# Patient Record
Sex: Male | Born: 1950 | Race: White | Hispanic: No | Marital: Married | Smoking: Former smoker
Health system: Southern US, Community
[De-identification: ages and names within clinical notes are randomized; demographics above are authoritative.]

## PROBLEM LIST (undated history)

## (undated) DIAGNOSIS — C801 Malignant (primary) neoplasm, unspecified: Secondary | ICD-10-CM

## (undated) HISTORY — PX: FINGER SURGERY: SHX640

---

## 2004-10-10 ENCOUNTER — Ambulatory Visit: Payer: Self-pay | Admitting: Internal Medicine

## 2004-10-11 ENCOUNTER — Ambulatory Visit: Payer: Self-pay | Admitting: Internal Medicine

## 2005-10-30 ENCOUNTER — Emergency Department: Payer: Self-pay | Admitting: Emergency Medicine

## 2010-10-22 ENCOUNTER — Emergency Department: Payer: Self-pay | Admitting: Emergency Medicine

## 2012-08-10 ENCOUNTER — Observation Stay: Payer: Self-pay | Admitting: Unknown Physician Specialty

## 2012-08-10 LAB — COMPREHENSIVE METABOLIC PANEL
Albumin: 3.8 g/dL (ref 3.4–5.0)
Anion Gap: 8 (ref 7–16)
BUN: 18 mg/dL (ref 7–18)
Bilirubin,Total: 0.8 mg/dL (ref 0.2–1.0)
Creatinine: 1.09 mg/dL (ref 0.60–1.30)
Glucose: 191 mg/dL — ABNORMAL HIGH (ref 65–99)
Osmolality: 285 (ref 275–301)
Potassium: 4 mmol/L (ref 3.5–5.1)
Sodium: 139 mmol/L (ref 136–145)
Total Protein: 7.9 g/dL (ref 6.4–8.2)

## 2012-08-10 LAB — CBC
HCT: 42.3 % (ref 40.0–52.0)
HGB: 14.9 g/dL (ref 13.0–18.0)
MCH: 30.9 pg (ref 26.0–34.0)
MCHC: 35.2 g/dL (ref 32.0–36.0)
WBC: 10.5 10*3/uL (ref 3.8–10.6)

## 2012-10-13 ENCOUNTER — Ambulatory Visit: Payer: Self-pay | Admitting: Unknown Physician Specialty

## 2012-10-15 LAB — PATHOLOGY REPORT

## 2014-06-24 ENCOUNTER — Emergency Department: Payer: Self-pay | Admitting: Emergency Medicine

## 2014-07-01 ENCOUNTER — Emergency Department: Payer: Self-pay | Admitting: Emergency Medicine

## 2014-10-05 ENCOUNTER — Encounter: Payer: Self-pay | Admitting: Orthopedic Surgery

## 2014-10-31 ENCOUNTER — Encounter: Payer: Self-pay | Admitting: Orthopedic Surgery

## 2015-01-31 ENCOUNTER — Ambulatory Visit: Payer: Self-pay | Admitting: Unknown Physician Specialty

## 2015-04-19 NOTE — H&P (Signed)
PATIENT NAME:  Nathaniel Dixon, Nathaniel Dixon MR#:  [id number] DATE OF BIRTH:  [birth year]  DATE OF ADMISSION:  08/10/2012  HISTORY OF PRESENT ILLNESS: The patient is a 64 year old white male who was eating lunch earlier today and had onset of obstruction of esophagus from food. He came to the Emergency Room where they tried nitroglycerin and Glucagon and then carbonated beverage, none of which helped open the esophagus. I was asked to see him in consultation. He takes no current medications. He has no known drug allergies. 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 He has had moderate difficulties in the past with swallowing, but it has always cleared up.   PHYSICAL EXAMINATION:   GENERAL: White male in no acute distress. Some heaving after drinking small amount of carbonated beverage.   HEENT: Sclerae anicteric. Conjunctivae negative. Tongue negative.   NECK: No thyromegaly.   CHEST: Clear.   HEART: No murmurs, gallops, clicks, or rubs.   ABDOMEN: Bowel sounds are present times. No hepatosplenomegaly. No masses. No bruits. No significant tenderness.  ASSESSMENT AND PLAN: After examination and discussing the procedure of endoscopy for foreign body removal under general anesthesia was discussed with the patient and with the anesthetist, the procedure was then performed with piecemeal removal of the food bolus. The area where the food was in the esophagus showed some esophagitis, inflammation.        The decision was made to admit him to the hospital afterwards for IV medications, IV fluids, PPI therapy, and possible IV pain medicine. NPO except for sips of water. Reassess tomorrow. ____________________________ Manya Silvas, MD rte:slb D: 08/10/2012 21:18:34 ET T: 08/11/2012 07:16:28 ET JOB#: [id number]  cc: Manya Silvas, MD, <Dictator> Manya Silvas MD ELECTRONICALLY SIGNED 08/19/2012 18:30

## 2015-04-19 NOTE — Consult Note (Signed)
Chief complaint is FB obst of esophagus with removal of food bolus.  VSS afebrile, some discomfort in lower throat.  Pt otherwise without complaints.  Will advance to clear liquids and if he tolerates this can go home.  Will need EGD with dilatation in 4 weeks.  Electronic Signatures: Manya Silvas (MD)  (Signed on 12-Aug-13 07:16)  Authored  Last Updated: 12-Aug-13 07:16 by Manya Silvas (MD)

## 2015-04-19 NOTE — Discharge Summary (Signed)
PATIENT NAME:  Nathaniel Dixon, Nathaniel Dixon MR#:  [id number] DATE OF BIRTH:  12/05/1951  DATE OF ADMISSION:  08/10/2012 DATE OF DISCHARGE:  08/11/2012  DISCHARGE DIAGNOSES:  1. Foreign body in the esophagus.  2. Status post esophagogastroduodenoscopy with foreign body removal, successful. 3. LA grade C acute esophagitis.   PROCEDURE: EGD for foreign body removal 08/10/2012.   FINAL PROGRESS NOTE: Patient swallowing clear liquids well, no odynophagia. No nausea, vomiting. Condition is good.   ADDITIONAL MEDICATIONS:  1. Omeprazole 20 mg over the counter 1 tablet 30 minutes before breakfast daily.  2. He was advised to avoid NSAIDs.   FOLLOW UP: Follow up office visit in 1 to 2 weeks, arrange for EGD in four weeks.   HISTORY OF PRESENT ILLNESS: This 64 year old Caucasian male was admitted to the hospital 08/10/2012 after he was eating lunch with pork, and had food bolus hung in the esophagus for about four hours. He presented to the Emergency Room where he was given nitroglycerin and glucagon and carbonated beverage and none of these helped to open the esophagus. Dr. Vira Agar saw him in consultation. Notes that patient has had some chronic intermittent problems swallowing but had always cleared up in the past. An upper endoscopy performed 08/10/2012 admission evening with findings of esophageal foreign body, removal was successful. There was LA grade C acute esophagitis. He was admitted for IV fluids, proton pump therapy, pain medication if needed. Over the course of the admission he did complain of some discomfort in the lower throat.   PAST MEDICAL HISTORY: Denies.   PAST SURGICAL HISTORY: Denies.   MEDICATIONS ON ADMISSION: None listed.   ALLERGIES: No known drug allergies.   LABORATORY, DIAGNOSTIC, AND RADIOLOGICAL DATA: Admission blood work 08/10/2012 with glucose 191, normal MET-B, liver panel with exception of AST slightly elevated 40. CBC normal. No radiology performed.   PHYSICAL  EXAMINATION: VITAL SIGNS: Stable, temperature 98.3. LUNGS: Clear to auscultation. ABDOMEN: Soft, nontender. SKIN: Patient presented with a sunburn from being out at the swimming pool and this noticeable on the trunk and face. No blistering. NEUROLOGICAL: He is alert, oriented, interacting appropriately. No hoarseness noted, swallowing without difficulty.   DISCHARGE INSTRUCTIONS: Discharge instructions given to patient. He was advised to have clear liquids only today, full liquid diet the next two days and we discussed this in detail. On Thursday he may eat soft foods, chew very well for the next month, eat slowly with sips of room temperature liquids in between bites. He is to avoid known trigger foods such as meats, potato chips, popcorn. Patient has plans to go to [facility] the end of October and is asking that the EGD be performed third week in October. We discuss this on follow-up office visit.   These services provided by Joelene Millin A. Jerelene Redden, MS, APRN, BC, ANP under collaborative agreement with Dr. Gaylyn Cheers.   ____________________________ Janalyn Harder. Jerelene Redden, ANP kam:cms D: 08/12/2012 08:34:41 ET T: 08/12/2012 14:21:06 ET JOB#: [id number]  cc: Joelene Millin A. Jerelene Redden, ANP, <Dictator>  Janalyn Harder. Sherlyn Hay, MSN, ANP-BC Adult Nurse Practitioner ELECTRONICALLY SIGNED 08/15/2012 13:08

## 2015-04-25 LAB — SURGICAL PATHOLOGY

## 2016-07-24 ENCOUNTER — Encounter: Payer: Self-pay | Admitting: Podiatry

## 2016-07-24 ENCOUNTER — Ambulatory Visit (INDEPENDENT_AMBULATORY_CARE_PROVIDER_SITE_OTHER): Payer: Medicare Other

## 2016-07-24 ENCOUNTER — Ambulatory Visit (INDEPENDENT_AMBULATORY_CARE_PROVIDER_SITE_OTHER): Payer: Medicare Other | Admitting: Podiatry

## 2016-07-24 DIAGNOSIS — M2042 Other hammer toe(s) (acquired), left foot: Secondary | ICD-10-CM | POA: Diagnosis not present

## 2016-07-24 DIAGNOSIS — L84 Corns and callosities: Secondary | ICD-10-CM | POA: Diagnosis not present

## 2016-07-24 DIAGNOSIS — R52 Pain, unspecified: Secondary | ICD-10-CM | POA: Diagnosis not present

## 2016-07-24 DIAGNOSIS — M2041 Other hammer toe(s) (acquired), right foot: Secondary | ICD-10-CM

## 2016-07-24 DIAGNOSIS — G629 Polyneuropathy, unspecified: Secondary | ICD-10-CM | POA: Diagnosis not present

## 2016-07-25 ENCOUNTER — Telehealth: Payer: Self-pay | Admitting: *Deleted

## 2016-07-25 DIAGNOSIS — G629 Polyneuropathy, unspecified: Secondary | ICD-10-CM

## 2016-07-25 NOTE — Progress Notes (Signed)
Subjective:     Patient ID: Nathaniel Dixon, male   DOB: 15-May-1951, 65 y.o.   MRN: [id number]  HPI 65 year old male presents the office today for concerns of pain to his fourth and fifth toes on both feet with the left side worse in the right. He states that his toes rubbing he gets a blister on the fifth toe. He has had the area turned previously has come back. He has seen other people for this. Denies any recent injury or trauma. He has a states he has numbness and burning pain to both of his feet which is been ongoing for quite some time. He is never seen by for this. He said no recent treatment for this. No other complaints at this time. No other concerns today.  Review of Systems  All other systems reviewed and are negative.      Objective:   Physical Exam General: AAO x3, NAD  Dermatological: Hyperkeratotic lesion present medial aspect left fifth toe. Upon debrided and no underlying ulceration, drainage or other signs of infection. No other open lesions or pre-ulcerative lesions identified this time.  Vascular: Dorsalis Pedis artery and Posterior Tibial artery pedal pulses are 2/4 bilateral with immedate capillary fill time. Pedal hair growth present. There is no pain with calf compression, swelling, warmth, erythema.   Neruologic: Sensation somewhat decreased with Derrel Nip monofilament, vibratory sensation intact, Achilles tendon reflex intact. Negative Tinel sign.   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 Mild tenderness on the hyperkeratotic lesion left fifth toe. No other areas of tenderness bilaterally. MMT 5/5, ROM WNL. Equinus.   Gait: Unassisted, Nonantalgic.      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 As of acute fracture or stress fracture this time. Hammertoes present. -Hyperkeratotic lesion debrided left fifth toe without complications or bleeding. Offloading pads were discussed. Discussed wider shoes. Discussed with him surgical intervention if symptoms continue. He'll consider doing this in the future potentially. -He also has neuropathy type symptoms. He has no known risk factors for neuropathy. At this point will order a nerve conduction test evaluation for neuropathy and possible neurology evaluation pending the study. Discussed potential medications to start the wall with results of the nerve conduction test before starting. -Follow-up after nerve conduction studies or sooner if needed.  Celesta Gentile, DPM

## 2016-07-25 NOTE — Telephone Encounter (Addendum)
-----   Message from Trula Slade, DPM sent at 07/25/2016 11:16 AM EDT ----- Can you order nerve conduction test to evaluate for neuropathy. Thanks. 08/24/2016-DrJacqualyn Posey reviewed NCV with EMG 08/15/2016 states pt has significant neuropathy without diabetes, refer to [facility]. Informed pt. Faxed to [facility].

## 2016-08-22 ENCOUNTER — Telehealth: Payer: Self-pay | Admitting: Podiatry

## 2016-08-22 NOTE — Telephone Encounter (Signed)
Pt called in about results from the never test. Please can you give pt a call

## 2016-08-22 NOTE — Telephone Encounter (Addendum)
Pt asked for the results of his NCV and EMG, [facility] stated they were sending to [facility]. I told pt I did not see them in the computer but would fax a request for them to be sent to our office and I would give to Dr. Jacqualyn Posey for review.  Pt states understanding.

## 2016-11-21 ENCOUNTER — Emergency Department: Payer: Medicare Other

## 2016-11-21 ENCOUNTER — Encounter: Payer: Self-pay | Admitting: Emergency Medicine

## 2016-11-21 ENCOUNTER — Emergency Department
Admission: EM | Admit: 2016-11-21 | Discharge: 2016-11-21 | Disposition: A | Discharge status: Home / Self Care | Payer: Medicare Other | Attending: Emergency Medicine | Admitting: Emergency Medicine

## 2016-11-21 DIAGNOSIS — J189 Pneumonia, unspecified organism: Secondary | ICD-10-CM | POA: Diagnosis not present

## 2016-11-21 DIAGNOSIS — R05 Cough: Secondary | ICD-10-CM | POA: Diagnosis present

## 2016-11-21 DIAGNOSIS — R059 Cough, unspecified: Secondary | ICD-10-CM

## 2016-11-21 LAB — CBC WITH DIFFERENTIAL/PLATELET
BASOS ABS: 0.1 10*3/uL (ref 0–0.1)
Basophils Relative: 1 %
Eosinophils Absolute: 1.1 10*3/uL — ABNORMAL HIGH (ref 0–0.7)
Eosinophils Relative: 10 %
HEMATOCRIT: 43.4 % (ref 40.0–52.0)
Hemoglobin: 14.9 g/dL (ref 13.0–18.0)
LYMPHS ABS: 2.6 10*3/uL (ref 1.0–3.6)
LYMPHS PCT: 24 %
MCH: 31 pg (ref 26.0–34.0)
MCHC: 34.3 g/dL (ref 32.0–36.0)
MCV: 90.1 fL (ref 80.0–100.0)
MONO ABS: 0.7 10*3/uL (ref 0.2–1.0)
MONOS PCT: 6 %
NEUTROS ABS: 6.5 10*3/uL (ref 1.4–6.5)
Neutrophils Relative %: 59 %
Platelets: 237 10*3/uL (ref 150–440)
RBC: 4.82 MIL/uL (ref 4.40–5.90)
RDW: 13.4 % (ref 11.5–14.5)
WBC: 11.1 10*3/uL — ABNORMAL HIGH (ref 3.8–10.6)

## 2016-11-21 LAB — COMPREHENSIVE METABOLIC PANEL
ALT: 16 U/L — ABNORMAL LOW (ref 17–63)
AST: 21 U/L (ref 15–41)
Albumin: 4.1 g/dL (ref 3.5–5.0)
Alkaline Phosphatase: 54 U/L (ref 38–126)
Anion gap: 8 (ref 5–15)
BILIRUBIN TOTAL: 0.7 mg/dL (ref 0.3–1.2)
BUN: 21 mg/dL — AB (ref 6–20)
CO2: 24 mmol/L (ref 22–32)
CREATININE: 1.17 mg/dL (ref 0.61–1.24)
Calcium: 9.2 mg/dL (ref 8.9–10.3)
Chloride: 103 mmol/L (ref 101–111)
Glucose, Bld: 97 mg/dL (ref 65–99)
POTASSIUM: 3.6 mmol/L (ref 3.5–5.1)
Sodium: 135 mmol/L (ref 135–145)
TOTAL PROTEIN: 8.2 g/dL — AB (ref 6.5–8.1)

## 2016-11-21 LAB — TROPONIN I

## 2016-11-21 MED ORDER — LEVOFLOXACIN IN D5W 750 MG/150ML IV SOLN
750.0000 mg | Freq: Once | INTRAVENOUS | Status: AC
  Administered 2016-11-21: 750 mg via INTRAVENOUS
  Filled 2016-11-21: qty 150

## 2016-11-21 MED ORDER — LEVOFLOXACIN 750 MG PO TABS: 750.0000 mg | ORAL_TABLET | Freq: Every day | ORAL | 0 refills | Status: AC

## 2016-11-21 MED ORDER — IPRATROPIUM-ALBUTEROL 0.5-2.5 (3) MG/3ML IN SOLN
3.0000 mL | Freq: Once | RESPIRATORY_TRACT | Status: AC
  Administered 2016-11-21: 3 mL via RESPIRATORY_TRACT
  Filled 2016-11-21: qty 3

## 2016-11-21 MED ORDER — BENZONATATE 100 MG PO CAPS: 100.0000 mg | ORAL_CAPSULE | Freq: Four times a day (QID) | ORAL | 0 refills | Status: DC | PRN

## 2016-11-21 NOTE — Discharge Instructions (Signed)
As we discussed please have a follow up x-ray in 3-4 weeks to make sure there is resolution of the pneumonia. Please seek medical attention for any high fevers, chest pain, shortness of breath, change in behavior, persistent vomiting, bloody stool or any other new or concerning symptoms.

## 2016-11-21 NOTE — ED Notes (Signed)

## 2016-11-21 NOTE — ED Triage Notes (Signed)
Patient reports nasal and chest congestion for 1+ month. Patient states "I feel like I have pneumonia". Patient sounds very congested. Ambulatory to triage without difficulty. Denies any fevers. Reports intermittently productive cough with thin white sputum.

## 2016-11-21 NOTE — ED Notes (Signed)
Per pt's request, discharge instructions and prescriptions given to pt's wife so she can pick them up before pharmacy closes.

## 2016-11-21 NOTE — ED Provider Notes (Signed)
[facility] Emergency Department Provider Note   ____________________________________________   I have reviewed the triage vital signs and the nursing notes.   HISTORY  Chief Complaint Shortness of Breath and Nasal Congestion   History limited by: Not Limited   HPI Nathaniel Dixon is a 65 y.o. male who presents to the emergency department today because of concerns for cough and weakness. He states he has had a cough for the past 2 months. However the past 2 days things got worse. The cough has gotten more frequent. He has occasional production with the cough. In addition he has felt more short of breath and weak the past 2 days. He has not noticed any fevers.   History reviewed. No pertinent past medical history.  There are no active problems to display for this patient.   Past Surgical History:  Procedure Laterality Date  . FINGER SURGERY Left     Prior to Admission medications   Not on File    Allergies Statins  No family history on file.  Social History Social History  Substance Use Topics  . Smoking status: Never Smoker  . Smokeless tobacco: Never Used  . Alcohol use No    Review of Systems  Constitutional: Negative for fever. Cardiovascular: Negative for chest pain. Respiratory: Positive for shortness of breath and cough. Gastrointestinal: Negative for abdominal pain, vomiting and diarrhea. Genitourinary: Negative for dysuria. Musculoskeletal: Negative for back pain. Skin: Negative for rash. Neurological: Negative for headaches, focal weakness or numbness.  10-point ROS otherwise negative.  ____________________________________________   PHYSICAL EXAM:  VITAL SIGNS: ED Triage Vitals  Enc Vitals Group     BP 11/21/16 1553 135/78     Pulse Rate 11/21/16 1553 89     Resp 11/21/16 1553 20     Temp 11/21/16 1553 98.2 F (36.8 C)     Temp Source 11/21/16 1553 Oral     SpO2 11/21/16 1553 94 %     Weight 11/21/16 1550  239 lb (108.4 kg)     Height 11/21/16 1550 '6\' 3"'$  (1.905 m)     Head Circumference --      Peak Flow --      Pain Score 11/21/16 1550 6   Constitutional: Alert and oriented. Well appearing and in no distress. Eyes: Conjunctivae are normal. Normal extraocular movements. ENT   Head: Normocephalic and atraumatic.   Nose: No congestion/rhinnorhea.   Mouth/Throat: Mucous membranes are moist.   Neck: No stridor. Hematological/Lymphatic/Immunilogical: No cervical lymphadenopathy. Cardiovascular: Normal rate, regular rhythm.  No murmurs, rubs, or gallops.  Respiratory: Normal respiratory effort without tachypnea nor retractions. Diffuse bilateral expiratory wheezing. Occasional dry cough. Gastrointestinal: Soft and nontender. No distention.  Genitourinary: Deferred Musculoskeletal: Normal range of motion in all extremities. No lower extremity edema. Neurologic:  Normal speech and language. No gross focal neurologic deficits are appreciated.  Skin:  Skin is warm, dry and intact. No rash noted. Psychiatric: Mood and affect are normal. Speech and behavior are normal. Patient exhibits appropriate insight and judgment.  ____________________________________________    LABS (pertinent positives/negatives)  Labs Reviewed  CBC WITH DIFFERENTIAL/PLATELET - Abnormal; Notable for the following:       Result Value   WBC 11.1 (*)    Eosinophils Absolute 1.1 (*)    All other components within normal limits  COMPREHENSIVE METABOLIC PANEL - Abnormal; Notable for the following:    BUN 21 (*)    Total Protein 8.2 (*)    ALT 16 (*)  All other components within normal limits  TROPONIN I     ____________________________________________   EKG  I, Nance Pear, attending physician, personally viewed and interpreted this EKG  EKG Time: 1554 Rate: 92 Rhythm: normal sinus rhythm Axis: normal Intervals: qtc 460 QRS: narrow ST changes: no st elevation Impression: normal  ekg  ____________________________________________   RADIOLOGY  CXR IMPRESSION:  Hazy opacity anterior segment left upper lobe, consistent with  pneumonia. Lungs elsewhere clear.    Followup PA and lateral chest radiographs recommended in 3-4 weeks  following trial of antibiotic therapy to ensure resolution and  exclude underlying malignancy.   ____________________________________________   PROCEDURES  Procedures  ____________________________________________   INITIAL IMPRESSION / ASSESSMENT AND PLAN / ED COURSE  Pertinent labs & imaging results that were available during my care of the patient were reviewed by me and considered in my medical decision making (see chart for details).  CXR concerning for PNA. Think likely patient had bronchitis with now bacterial pneumonia. Will give dose of IV abx here and discharge with abx prescription and medication for cough.   ____________________________________________   FINAL CLINICAL IMPRESSION(S) / ED DIAGNOSES  Pneumonia.  Note: This dictation was prepared with Dragon dictation. Any transcriptional errors that result from this process are unintentional    Nance Pear, MD 11/21/16 1724

## 2017-03-31 ENCOUNTER — Encounter: Payer: Self-pay | Admitting: Emergency Medicine

## 2017-03-31 ENCOUNTER — Emergency Department: Payer: Medicare Other

## 2017-03-31 ENCOUNTER — Inpatient Hospital Stay
Admission: EM | Admit: 2017-03-31 | Discharge: 2017-04-02 | DRG: 871 | Disposition: A | Discharge status: Home / Self Care | Payer: Medicare Other | Attending: Internal Medicine | Admitting: Internal Medicine

## 2017-03-31 DIAGNOSIS — Z801 Family history of malignant neoplasm of trachea, bronchus and lung: Secondary | ICD-10-CM

## 2017-03-31 DIAGNOSIS — Z9221 Personal history of antineoplastic chemotherapy: Secondary | ICD-10-CM | POA: Diagnosis not present

## 2017-03-31 DIAGNOSIS — K219 Gastro-esophageal reflux disease without esophagitis: Secondary | ICD-10-CM | POA: Diagnosis present

## 2017-03-31 DIAGNOSIS — C779 Secondary and unspecified malignant neoplasm of lymph node, unspecified: Secondary | ICD-10-CM | POA: Diagnosis present

## 2017-03-31 DIAGNOSIS — G893 Neoplasm related pain (acute) (chronic): Secondary | ICD-10-CM | POA: Diagnosis present

## 2017-03-31 DIAGNOSIS — Z79899 Other long term (current) drug therapy: Secondary | ICD-10-CM | POA: Diagnosis not present

## 2017-03-31 DIAGNOSIS — N183 Chronic kidney disease, stage 3 (moderate): Secondary | ICD-10-CM | POA: Diagnosis present

## 2017-03-31 DIAGNOSIS — A419 Sepsis, unspecified organism: Principal | ICD-10-CM | POA: Diagnosis present

## 2017-03-31 DIAGNOSIS — F329 Major depressive disorder, single episode, unspecified: Secondary | ICD-10-CM | POA: Diagnosis present

## 2017-03-31 DIAGNOSIS — E871 Hypo-osmolality and hyponatremia: Secondary | ICD-10-CM | POA: Diagnosis present

## 2017-03-31 DIAGNOSIS — G629 Polyneuropathy, unspecified: Secondary | ICD-10-CM | POA: Diagnosis present

## 2017-03-31 DIAGNOSIS — E785 Hyperlipidemia, unspecified: Secondary | ICD-10-CM | POA: Diagnosis present

## 2017-03-31 DIAGNOSIS — E876 Hypokalemia: Secondary | ICD-10-CM | POA: Diagnosis present

## 2017-03-31 DIAGNOSIS — N400 Enlarged prostate without lower urinary tract symptoms: Secondary | ICD-10-CM | POA: Diagnosis present

## 2017-03-31 DIAGNOSIS — Z923 Personal history of irradiation: Secondary | ICD-10-CM | POA: Diagnosis not present

## 2017-03-31 DIAGNOSIS — G4700 Insomnia, unspecified: Secondary | ICD-10-CM | POA: Diagnosis present

## 2017-03-31 DIAGNOSIS — Z87891 Personal history of nicotine dependence: Secondary | ICD-10-CM | POA: Diagnosis not present

## 2017-03-31 DIAGNOSIS — C3490 Malignant neoplasm of unspecified part of unspecified bronchus or lung: Secondary | ICD-10-CM | POA: Diagnosis present

## 2017-03-31 DIAGNOSIS — Z6825 Body mass index (BMI) 25.0-25.9, adult: Secondary | ICD-10-CM

## 2017-03-31 DIAGNOSIS — E43 Unspecified severe protein-calorie malnutrition: Secondary | ICD-10-CM | POA: Diagnosis present

## 2017-03-31 DIAGNOSIS — J189 Pneumonia, unspecified organism: Secondary | ICD-10-CM | POA: Diagnosis present

## 2017-03-31 DIAGNOSIS — J181 Lobar pneumonia, unspecified organism: Secondary | ICD-10-CM

## 2017-03-31 DIAGNOSIS — Z800 Family history of malignant neoplasm of digestive organs: Secondary | ICD-10-CM

## 2017-03-31 HISTORY — DX: Malignant (primary) neoplasm, unspecified: C80.1

## 2017-03-31 LAB — COMPREHENSIVE METABOLIC PANEL
ALBUMIN: 3.2 g/dL — AB (ref 3.5–5.0)
ALK PHOS: 55 U/L (ref 38–126)
ALT: 15 U/L — ABNORMAL LOW (ref 17–63)
ANION GAP: 11 (ref 5–15)
AST: 20 U/L (ref 15–41)
BILIRUBIN TOTAL: 0.6 mg/dL (ref 0.3–1.2)
BUN: 24 mg/dL — ABNORMAL HIGH (ref 6–20)
CALCIUM: 8.6 mg/dL — AB (ref 8.9–10.3)
CO2: 25 mmol/L (ref 22–32)
Chloride: 97 mmol/L — ABNORMAL LOW (ref 101–111)
Creatinine, Ser: 1.02 mg/dL (ref 0.61–1.24)
GFR calc Af Amer: >60 mL/min (ref >60)
GFR calc non Af Amer: >60 mL/min (ref >60)
Glucose, Bld: 124 mg/dL — ABNORMAL HIGH (ref 65–99)
Potassium: 3.3 mmol/L — ABNORMAL LOW (ref 3.5–5.1)
Sodium: 133 mmol/L — ABNORMAL LOW (ref 135–145)
TOTAL PROTEIN: 6.8 g/dL (ref 6.5–8.1)

## 2017-03-31 LAB — INFLUENZA PANEL BY PCR (TYPE A & B)
Influenza A By PCR: NEGATIVE
Influenza B By PCR: NEGATIVE

## 2017-03-31 LAB — PROTIME-INR
INR: 0.97
PROTHROMBIN TIME: 12.9 s (ref 11.4–15.2)

## 2017-03-31 LAB — CBC WITH DIFFERENTIAL/PLATELET
BASOS PCT: 0 %
Basophils Absolute: 0 10*3/uL (ref 0–0.1)
EOS ABS: 0 10*3/uL (ref 0–0.7)
EOS PCT: 1 %
HCT: 34.4 % — ABNORMAL LOW (ref 40.0–52.0)
Hemoglobin: 12.3 g/dL — ABNORMAL LOW (ref 13.0–18.0)
LYMPHS ABS: 0.2 10*3/uL — AB (ref 1.0–3.6)
Lymphocytes Relative: 5 %
MCH: 31.1 pg (ref 26.0–34.0)
MCHC: 35.7 g/dL (ref 32.0–36.0)
MCV: 87 fL (ref 80.0–100.0)
MONOS PCT: 2 %
Monocytes Absolute: 0.1 10*3/uL — ABNORMAL LOW (ref 0.2–1.0)
NEUTROS PCT: 92 %
Neutro Abs: 3.5 10*3/uL (ref 1.4–6.5)
PLATELETS: 188 10*3/uL (ref 150–440)
RBC: 3.96 MIL/uL — AB (ref 4.40–5.90)
RDW: 15 % — ABNORMAL HIGH (ref 11.5–14.5)
WBC: 3.9 10*3/uL (ref 3.8–10.6)

## 2017-03-31 LAB — PROCALCITONIN: PROCALCITONIN: 0.11 ng/mL

## 2017-03-31 LAB — TROPONIN I: Troponin I: <0.03 ng/mL (ref <0.03)

## 2017-03-31 LAB — LACTIC ACID, PLASMA: LACTIC ACID, VENOUS: 1.9 mmol/L (ref 0.5–1.9)

## 2017-03-31 LAB — LIPASE, BLOOD

## 2017-03-31 MED ORDER — DEXTROSE 5 % IV SOLN: 1.0000 g | Freq: Once | INTRAVENOUS | Status: DC

## 2017-03-31 MED ORDER — SODIUM CHLORIDE 0.9 % IV BOLUS (SEPSIS)
30.0000 mL/kg | Freq: Once | INTRAVENOUS | Status: AC
  Administered 2017-03-31: 2721 mL via INTRAVENOUS

## 2017-03-31 MED ORDER — SODIUM CHLORIDE 0.9 % IV BOLUS (SEPSIS): 1000.0000 mL | Freq: Once | INTRAVENOUS | Status: DC

## 2017-03-31 MED ORDER — AZITHROMYCIN 500 MG IV SOLR
500.0000 mg | Freq: Once | INTRAVENOUS | Status: AC
  Administered 2017-03-31: 500 mg via INTRAVENOUS
  Filled 2017-03-31: qty 500

## 2017-03-31 MED ORDER — CEFTRIAXONE SODIUM-DEXTROSE 1-3.74 GM-% IV SOLR
1.0000 g | Freq: Once | INTRAVENOUS | Status: AC
  Administered 2017-03-31: 1 g via INTRAVENOUS
  Filled 2017-03-31: qty 50

## 2017-03-31 MED ORDER — ONDANSETRON HCL 4 MG/2ML IJ SOLN
4.0000 mg | Freq: Once | INTRAMUSCULAR | Status: DC
  Filled 2017-03-31: qty 2

## 2017-03-31 NOTE — H&P (Signed)
History and Physical   [facility] [place] [facility] Admission History and Physical [employer], D.O.    Patient Name: Nathaniel Dixon MR#: [id number] Date of Birth: 1951/10/01 Date of Admission: 03/31/2017  Referring MD/NP/PA: Dr. Lessie Dings Primary Care Physician: Gayland Curry, MD Patient coming from: Home Outpatient Specialists: [facility], radiation oncology   Chief Complaint:  Chief Complaint  Patient presents with  . Fever    HPI: Nathaniel Dixon is a 66 y.o. male with a known history of stage III [place] Lung Cancer on chemo and s/p radiation, BPH, polyneuropathy, HLD, CKD3, presents to the emergency department for evaluation of shortness of breath .  Patient was in a usual state of health until three days ago when he developed shortness of breathAnd dyspnea on exertion Associated with cough productive of clear to white sputum.. Temp to 100.7 today.  Was referred to ED by oncologist.   Patient denies weakness, dizziness, chest pain, N/V/C/D, abdominal pain, dysuria/frequency, changes in mental status.    Last chemotherapy was 5 days ago and he gets chemotherapy every 3 weeks Otherwise there has been no change in status. Patient has been taking medication as prescribed and there has been no recent change in medication or diet.  No recent antibiotics.  There has been no recent illness, hospitalizations, travel or sick contacts.    EMS/ED Course: Patient received NS, Rocephin, Zithro.  Review of Systems:  CONSTITUTIONAL: Positive  fever/chills, fatigue, negative weakness, weight gain/loss, headache. EYES: No blurry or double vision. ENT: No tinnitus, postnasal drip, redness or soreness of the oropharynx. RESPIRATORY: Positive cough, dyspnea, negative wheeze.  No hemoptysis.  CARDIOVASCULAR: No chest pain, palpitations, syncope, orthopnea. No lower extremity edema.  GASTROINTESTINAL: No nausea, vomiting, abdominal pain, diarrhea, constipation.  No hematemesis,  melena or hematochezia. GENITOURINARY: No dysuria, frequency, hematuria. ENDOCRINE: No polyuria or nocturia. No heat or cold intolerance. HEMATOLOGY: No anemia, bruising, bleeding. INTEGUMENTARY: No rashes, ulcers, lesions. MUSCULOSKELETAL: No arthritis, gout, dyspnea. NEUROLOGIC: No numbness, tingling, ataxia, seizure-type activity, weakness. PSYCHIATRIC: No anxiety, depression, insomnia.   Past Medical History:  Diagnosis Date  . Cancer ([place])    lung  BPH, polyneuropathy, HLD, CKD3,   Past Surgical History:  Procedure Laterality Date  . FINGER SURGERY Left      reports that he has quit smoking. He has never used smokeless tobacco. He reports that he drinks alcohol. He reports that he does not use drugs.  Allergies  Allergen Reactions  . Statins Rash    Pain in legs, weakness    Family History   Medical History Relation Name Comments  Cancer Brother    Colon cancer Brother    Lung cancer Father    Osteoarthritis Father    Stroke Mother    Anesthesia problems Neg Hx       Prior to Admission medications   Medication Sig Start Date End Date Taking? Authorizing Provider  benzonatate (TESSALON PERLES) 100 MG capsule Take 1 capsule (100 mg total) by mouth every 6 (six) hours as needed for cough. 11/21/16 11/21/17  Nance Pear, MD    Physical Exam: Vitals:   03/31/17 2028  BP: 93/65  Pulse: (!) 124  Resp: (!) 22  Temp: 98.3 F (36.8 C)  TempSrc: Oral  SpO2: 96%  Weight: 90.7 kg (200 lb)  Height: '6\' 2"'$  (1.88 m)    GENERAL: 66 y.o.-year-old male patient, well-developed, well-nourished lying in the bed in no acute distress.  Pleasant and cooperative.   HEENT: Head atraumatic, normocephalic. Pupils  equal, round, reactive to light and accommodation. No scleral icterus. Extraocular muscles intact. Nares are patent. Oropharynx is clear. Mucus membranes moist. NECK: Supple, full range of motion. No JVD, no bruit heard. No thyroid enlargement, no  tenderness, no cervical lymphadenopathy. CHEST: Normal breath sounds bilaterally. No wheezing, rales, rhonchi or crackles. No use of accessory muscles of respiration.  No reproducible chest wall tenderness.  CARDIOVASCULAR: S1, S2 normal. No murmurs, rubs, or gallops. Cap refill <2 seconds. Pulses intact distally.  ABDOMEN: Soft, nondistended, nontender. No rebound, guarding, rigidity. Normoactive bowel sounds present in all four quadrants. No organomegaly or mass. EXTREMITIES: No pedal edema, cyanosis, or clubbing. No calf tenderness or Homan's sign.  NEUROLOGIC: The patient is alert and oriented x 3. Cranial nerves II through XII are grossly intact with no focal sensorimotor deficit. Muscle strength 5/5 in all extremities. Sensation intact. Gait not checked. PSYCHIATRIC:  Normal affect, mood, thought content. SKIN: Warm, dry, and intact without obvious rash, lesion, or ulcer.    Labs on Admission:  CBC:  Recent Labs Lab 03/31/17 2056  WBC 3.9  NEUTROABS 3.5  HGB 12.3*  HCT 34.4*  MCV 87.0  PLT 240   Basic Metabolic Panel:  Recent Labs Lab 03/31/17 2056  NA 133*  K 3.3*  CL 97*  CO2 25  GLUCOSE 124*  BUN 24*  CREATININE 1.02  CALCIUM 8.6*   GFR: Estimated Creatinine Clearance: 81.7 mL/min (by C-G formula based on SCr of 1.02 mg/dL). Liver Function Tests:  Recent Labs Lab 03/31/17 2056  AST 20  ALT 15*  ALKPHOS 55  BILITOT 0.6  PROT 6.8  ALBUMIN 3.2*    Recent Labs Lab 03/31/17 2056  LIPASE <10*   No results for input(s): AMMONIA in the last 168 hours. Coagulation Profile:  Recent Labs Lab 03/31/17 2056  INR 0.97   Cardiac Enzymes:  Recent Labs Lab 03/31/17 2056  TROPONINI <0.03   BNP (last 3 results) No results for input(s): PROBNP in the last 8760 hours. HbA1C: No results for input(s): HGBA1C in the last 72 hours. CBG: No results for input(s): GLUCAP in the last 168 hours. Lipid Profile: No results for input(s): CHOL, HDL, LDLCALC,  TRIG, CHOLHDL, LDLDIRECT in the last 72 hours. Thyroid Function Tests: No results for input(s): TSH, T4TOTAL, FREET4, T3FREE, THYROIDAB in the last 72 hours. Anemia Panel: No results for input(s): VITAMINB12, FOLATE, FERRITIN, TIBC, IRON, RETICCTPCT in the last 72 hours. Urine analysis: No results found for: COLORURINE, APPEARANCEUR, LABSPEC, PHURINE, GLUCOSEU, HGBUR, BILIRUBINUR, KETONESUR, PROTEINUR, UROBILINOGEN, NITRITE, LEUKOCYTESUR Sepsis Labs: '@LABRCNTIP'$ (procalcitonin:4,lacticidven:4) )No results found for this or any previous visit (from the past 240 hour(s)).   Radiological Exams on Admission: Dg Chest Port 1 View  Result Date: 03/31/2017 CLINICAL DATA:  Cough, congestion and fever with dyspnea yesterday. Patient receiving chemotherapy for lung cancer. EXAM: PORTABLE CHEST 1 VIEW COMPARISON:  11/21/2016 FINDINGS: The heart size and mediastinal contours are within normal limits. Subtle new opacity in the right upper lobe may reflect a subtle pneumonia. Pleural-parenchymal scarring in the left upper lobe. No dominant mass, effusion or pneumothorax. Aortic atherosclerosis. The visualized skeletal structures are unremarkable. IMPRESSION: Scarring in the left upper lobe. Possible subtle pneumonia in the right upper lobe. Followup PA and lateral chest X-ray is recommended in 3-4 weeks following trial of antibiotic therapy to ensure resolution and exclude underlying malignancy. Electronically Signed   By: Ashley Royalty M.D.   On: 03/31/2017 22:33    EKG: Sinus tachycardia @ 114 bpm with normal axis,  inferior Q waves, poor R progression and nonspecific ST-T wave changes.   Assessment/Plan  This is a 66 y.o. male with a history of  stage III [place] Lung Cancer s/p chemo and radiation, BPH, polyneuropathy, HLD, CKD3,  now being admitted with:  #. Sepsis secondary to CAP.  - Admit to inpatient with telemetry monitoring - IV antibiotics: Rocephin, Zithro - IV fluid hydration - O2, mednebs,  expectorants as needed - Follow up blood,urine & sputum cultures - Repeat CBC in am.   #. Hyponatremia - IVFs  #. Hypokalemia - Replace PO and recheck BMP in AM  #. History of BPH, not currently medicated  #. History of HLD, not currently medicated  #. History of GERD - Continue Prilosec  #. History of non-small cell lung cancer - Continue pain control with MS Contin  Caryl Pina. History of insomnia -Continue Remeron  Admission status: Inpatient IV Fluids: Normal saline Diet/Nutrition: Heart healthy Consults called: None  DVT Px: Lovenox, SCDs and early ambulation. Code Status: Full Code  Disposition Plan: To home in 1-2 days  All the records are reviewed and case discussed with ED provider. Management plans discussed with the patient and/or family who express understanding and agree with plan of care.  Cookie Pore D.O. on 03/31/2017 at 10:44 PM Between 7am to 6pm - Pager - [phone] After 6pm go to [email or web address] - [employer] [facility] Office [phone] CC: Primary care physician; Gayland Curry, MD   03/31/2017, 10:44 PM

## 2017-03-31 NOTE — ED Triage Notes (Signed)
Patient is a lung cancer patient receiving chemo. Patient states that he started having cough, congestion, fever and shortness of breath yesterday. Patient states that he had a temp of 100.7 at home. Patient spoke with his MD and was told to come to the emergency room.

## 2017-03-31 NOTE — ED Provider Notes (Signed)
[facility] Emergency Department Provider Note  ____________________________________________   First MD Initiated Contact with Patient 03/31/17 2042     (approximate)  I have reviewed the triage vital signs and the nursing notes.   HISTORY  Chief Complaint Fever    HPI Nathaniel Dixon is a 66 y.o. male who self presents to the emergency department with a fever to 100.7 at home, increasing cough and increasing shortness of breath. He has a history of stage III lung cancer with a left sided mass and is status post chemotherapy and radiation therapy. He did not get a flu shot this year. He says that for the past several days he has felt dehydrated and has had more difficulty catching his breath. Today he called his oncologist at [facility] who advised him to come to the emergency department for evaluation. He had not had a bowel movement in 3 days until he got to her Hospital when he had a large bowel movement. He denies abdominal pain. He does feel somewhat nauseated.   Past Medical History:  Diagnosis Date  . Cancer [facility])    lung    Patient Active Problem List   Diagnosis Date Noted  . Sepsis due to pneumonia ([place]) 03/31/2017    Past Surgical History:  Procedure Laterality Date  . FINGER SURGERY Left     Prior to Admission medications   Not on File    Allergies Statins  No family history on file.  Social History Social History  Substance Use Topics  . Smoking status: Former Research scientist (life sciences)  . Smokeless tobacco: Never Used  . Alcohol use Yes     Comment: occ    Review of Systems Constitutional: Positive fevers Eyes: No visual changes. ENT: No sore throat. Cardiovascular: Positive chest pain. Respiratory: Positive shortness of breath. Gastrointestinal: No abdominal pain.  Positive nausea, no vomiting.  No diarrhea.  No constipation. Genitourinary: Negative for dysuria. Musculoskeletal: Negative for back pain. Skin: Negative for  rash. Neurological: Negative for headaches, focal weakness or numbness.  10-point ROS otherwise negative.  ____________________________________________   PHYSICAL EXAM:  VITAL SIGNS: ED Triage Vitals [03/31/17 2028]  Enc Vitals Group     BP 93/65     Pulse Rate (!) 124     Resp (!) 22     Temp 98.3 F (36.8 C)     Temp Source Oral     SpO2 96 %     Weight 200 lb (90.7 kg)     Height '6\' 2"'$  (1.88 m)     Head Circumference      Peak Flow      Pain Score      Pain Loc      Pain Edu?      Excl. in [place]?     Constitutional: Alert and oriented x 4 Appears short of breath speaking in short sentences Eyes: PERRL EOMI. Head: Atraumatic. Nose: No congestion/rhinnorhea. Mouth/Throat: No trismus Neck: No stridor.   Cardiovascular: Tachycardic rate, regular rhythm. Grossly normal heart sounds.  Good peripheral circulation. Respiratory: Increased respiratory effort although lungs are clear and moving good air Gastrointestinal: Soft nondistended nontender no rebound no guarding no peritonitis no McBurney's tenderness negative Rovsing's no costovertebral tenderness negative Murphy's Musculoskeletal: No lower extremity edema   Neurologic:  Normal speech and language. No gross focal neurologic deficits are appreciated. Skin:  Skin is warm, dry and intact. No rash noted. Psychiatric: Mood and affect are normal. Speech and behavior are normal.  ____________________________________________   DIFFERENTIAL  Pneumonia, influenza, pulmonary embolism, sepsis ____________________________________________   LABS (all labs ordered are listed, but only abnormal results are displayed)  Labs Reviewed  COMPREHENSIVE METABOLIC PANEL - Abnormal; Notable for the following:       Result Value   Sodium 133 (*)    Potassium 3.3 (*)    Chloride 97 (*)    Glucose, Bld 124 (*)    BUN 24 (*)    Calcium 8.6 (*)    Albumin 3.2 (*)    ALT 15 (*)    All other components within normal limits   LIPASE, BLOOD - Abnormal; Notable for the following:    Lipase <10 (*)    All other components within normal limits  CBC WITH DIFFERENTIAL/PLATELET - Abnormal; Notable for the following:    RBC 3.96 (*)    Hemoglobin 12.3 (*)    HCT 34.4 (*)    RDW 15.0 (*)    Lymphs Abs 0.2 (*)    Monocytes Absolute 0.1 (*)    All other components within normal limits  CULTURE, BLOOD (ROUTINE X 2)  CULTURE, BLOOD (ROUTINE X 2)  URINE CULTURE  LACTIC ACID, PLASMA  TROPONIN I  PROCALCITONIN  PROTIME-INR  INFLUENZA PANEL BY PCR (TYPE A & B)  LACTIC ACID, PLASMA  URINALYSIS, ROUTINE W REFLEX MICROSCOPIC    Low white count and elevated pro calcitonin or concerning for bacterial infection __________________________________________  EKG  ED ECG REPORT I, Darel Hong, the attending physician, personally viewed and interpreted this ECG.  Date: 03/31/2017 Rate: 114 Rhythm: Sinus tachycardia QRS Axis: normal Intervals: normal ST/T Wave abnormalities: normal Conduction Disturbances: none Narrative Interpretation: Abnormal poor R-wave progression no acute ischemia noted  ____________________________________________  RADIOLOGY  Chest x-ray consistent with pneumonia ____________________________________________   PROCEDURES  Procedure(s) performed: no  Procedures  Critical Care performed: *yes  CRITICAL CARE Performed by: Darel Hong   Total critical care time: 35 minutes  Critical care time was exclusive of separately billable procedures and treating other patients.  Critical care was necessary to treat or prevent imminent or life-threatening deterioration.  Critical care was time spent personally by me on the following activities: development of treatment plan with patient and/or surrogate as well as nursing, discussions with consultants, evaluation of patient's response to treatment, examination of patient, obtaining history from patient or surrogate, ordering and  performing treatments and interventions, ordering and review of laboratory studies, ordering and review of radiographic studies, pulse oximetry and re-evaluation of patient's condition.   ____________________________________________   INITIAL IMPRESSION / ASSESSMENT AND PLAN / ED COURSE  Pertinent labs & imaging results that were available during my care of the patient were reviewed by me and considered in my medical decision making (see chart for details).  On arrival the patient was not febrile but he had taken his temperature at home and was 100.7. He is short of breath to An tachycardic which is concerning for pneumonia. Chest x-ray confirms and his white count is low along with elevated pro calcitonin. He has not been hospitalized recently so it is reasonable to treat him with hospital-acquired pneumonia antibiotics. He requires inpatient admission. 30 cc/kg of fluid in.      ____________________________________________   FINAL CLINICAL IMPRESSION(S) / ED DIAGNOSES  Final diagnoses:  Community acquired pneumonia of right upper lobe of lung ([place])  Sepsis, due to unspecified organism ([place])      NEW MEDICATIONS STARTED DURING THIS VISIT:  New Prescriptions   No medications on  file     Note:  This document was prepared using Dragon voice recognition software and may include unintentional dictation errors.     Darel Hong, MD 03/31/17 2258

## 2017-04-01 LAB — CBC
HCT: 29.6 % — ABNORMAL LOW (ref 40.0–52.0)
Hemoglobin: 10.4 g/dL — ABNORMAL LOW (ref 13.0–18.0)
MCH: 30.6 pg (ref 26.0–34.0)
MCHC: 35.1 g/dL (ref 32.0–36.0)
MCV: 87.2 fL (ref 80.0–100.0)
PLATELETS: 130 10*3/uL — AB (ref 150–440)
RBC: 3.39 MIL/uL — ABNORMAL LOW (ref 4.40–5.90)
RDW: 15 % — AB (ref 11.5–14.5)
WBC: 2.9 10*3/uL — ABNORMAL LOW (ref 3.8–10.6)

## 2017-04-01 LAB — URINALYSIS, ROUTINE W REFLEX MICROSCOPIC
BILIRUBIN URINE: NEGATIVE
GLUCOSE, UA: NEGATIVE mg/dL
HGB URINE DIPSTICK: NEGATIVE
KETONES UR: NEGATIVE mg/dL
Leukocytes, UA: NEGATIVE
Nitrite: NEGATIVE
PH: 6 (ref 5.0–8.0)
PROTEIN: NEGATIVE mg/dL
Specific Gravity, Urine: 1.015 (ref 1.005–1.030)

## 2017-04-01 LAB — STREP PNEUMONIAE URINARY ANTIGEN: Strep Pneumo Urinary Antigen: NEGATIVE

## 2017-04-01 LAB — BASIC METABOLIC PANEL
Anion gap: 8 (ref 5–15)
BUN: 22 mg/dL — AB (ref 6–20)
CHLORIDE: 101 mmol/L (ref 101–111)
CO2: 23 mmol/L (ref 22–32)
CREATININE: 0.79 mg/dL (ref 0.61–1.24)
Calcium: 7.6 mg/dL — ABNORMAL LOW (ref 8.9–10.3)
GFR calc Af Amer: >60 mL/min (ref >60)
GFR calc non Af Amer: >60 mL/min (ref >60)
Glucose, Bld: 94 mg/dL (ref 65–99)
Potassium: 3.4 mmol/L — ABNORMAL LOW (ref 3.5–5.1)
SODIUM: 132 mmol/L — AB (ref 135–145)

## 2017-04-01 LAB — PHOSPHORUS: Phosphorus: 4.5 mg/dL (ref 2.5–4.6)

## 2017-04-01 LAB — LACTIC ACID, PLASMA: LACTIC ACID, VENOUS: 1 mmol/L (ref 0.5–1.9)

## 2017-04-01 LAB — MAGNESIUM: MAGNESIUM: 1.4 mg/dL — AB (ref 1.7–2.4)

## 2017-04-01 MED ORDER — ONDANSETRON HCL 4 MG PO TABS: 4.0000 mg | ORAL_TABLET | Freq: Four times a day (QID) | ORAL | Status: DC | PRN

## 2017-04-01 MED ORDER — IPRATROPIUM BROMIDE 0.02 % IN SOLN: 0.5000 mg | Freq: Four times a day (QID) | RESPIRATORY_TRACT | Status: DC | PRN

## 2017-04-01 MED ORDER — ENSURE ENLIVE PO LIQD
237.0000 mL | Freq: Three times a day (TID) | ORAL | Status: DC
  Administered 2017-04-01 – 2017-04-02 (×2): 237 mL via ORAL

## 2017-04-01 MED ORDER — FOLIC ACID 0.5 MG HALF TAB
500.0000 ug | ORAL_TABLET | Freq: Every day | ORAL | Status: DC
  Administered 2017-04-01: 09:00:00 0.5 mg via ORAL
  Administered 2017-04-02: 10:00:00 via ORAL
  Filled 2017-04-01 (×2): qty 1

## 2017-04-01 MED ORDER — ALBUTEROL SULFATE (2.5 MG/3ML) 0.083% IN NEBU: 2.5000 mg | INHALATION_SOLUTION | Freq: Four times a day (QID) | RESPIRATORY_TRACT | Status: DC | PRN

## 2017-04-01 MED ORDER — LIDOCAINE VISCOUS 2 % MT SOLN
20.0000 mL | OROMUCOSAL | Status: DC | PRN
  Filled 2017-04-01: qty 20

## 2017-04-01 MED ORDER — ONDANSETRON HCL 4 MG/2ML IJ SOLN: 4.0000 mg | Freq: Four times a day (QID) | INTRAMUSCULAR | Status: DC | PRN

## 2017-04-01 MED ORDER — POTASSIUM CHLORIDE CRYS ER 20 MEQ PO TBCR
40.0000 meq | EXTENDED_RELEASE_TABLET | Freq: Two times a day (BID) | ORAL | Status: AC
  Administered 2017-04-01: 09:00:00 40 meq via ORAL
  Filled 2017-04-01: qty 2

## 2017-04-01 MED ORDER — CEFTRIAXONE SODIUM 1 G IJ SOLR
1.0000 g | INTRAMUSCULAR | Status: DC
  Administered 2017-04-01: 1 g via INTRAVENOUS
  Filled 2017-04-01: qty 10

## 2017-04-01 MED ORDER — MAGNESIUM CITRATE PO SOLN
1.0000 | Freq: Once | ORAL | Status: DC | PRN
  Filled 2017-04-01: qty 296

## 2017-04-01 MED ORDER — MIRTAZAPINE 15 MG PO TABS
15.0000 mg | ORAL_TABLET | Freq: Every day | ORAL | Status: DC
  Administered 2017-04-01: 15 mg via ORAL
  Filled 2017-04-01: qty 1

## 2017-04-01 MED ORDER — ENOXAPARIN SODIUM 40 MG/0.4ML ~~LOC~~ SOLN
40.0000 mg | SUBCUTANEOUS | Status: DC
  Administered 2017-04-01 (×2): 40 mg via SUBCUTANEOUS
  Filled 2017-04-01 (×2): qty 0.4

## 2017-04-01 MED ORDER — DEXTROMETHORPHAN POLISTIREX ER 30 MG/5ML PO SUER
30.0000 mg | Freq: Two times a day (BID) | ORAL | Status: DC
  Administered 2017-04-01 – 2017-04-02 (×3): 30 mg via ORAL
  Filled 2017-04-01 (×7): qty 5

## 2017-04-01 MED ORDER — VITAMIN B-12 1000 MCG PO TABS
1000.0000 ug | ORAL_TABLET | Freq: Every day | ORAL | Status: DC
  Administered 2017-04-01 – 2017-04-02 (×2): 1000 ug via ORAL
  Filled 2017-04-01 (×2): qty 1

## 2017-04-01 MED ORDER — OXYCODONE HCL 5 MG PO TABS: 5.0000 mg | ORAL_TABLET | ORAL | Status: DC | PRN

## 2017-04-01 MED ORDER — GUAIFENESIN ER 600 MG PO TB12
600.0000 mg | ORAL_TABLET | Freq: Two times a day (BID) | ORAL | Status: DC
  Administered 2017-04-01 – 2017-04-02 (×3): 600 mg via ORAL
  Filled 2017-04-01 (×3): qty 1

## 2017-04-01 MED ORDER — SODIUM CHLORIDE 0.9 % IV SOLN
INTRAVENOUS | Status: DC
  Administered 2017-04-01 (×2): via INTRAVENOUS

## 2017-04-01 MED ORDER — BISACODYL 5 MG PO TBEC: 5.0000 mg | DELAYED_RELEASE_TABLET | Freq: Every day | ORAL | Status: DC | PRN

## 2017-04-01 MED ORDER — ACETAMINOPHEN 325 MG PO TABS: 650.0000 mg | ORAL_TABLET | Freq: Four times a day (QID) | ORAL | Status: DC | PRN

## 2017-04-01 MED ORDER — ACETAMINOPHEN 650 MG RE SUPP: 650.0000 mg | Freq: Four times a day (QID) | RECTAL | Status: DC | PRN

## 2017-04-01 MED ORDER — DEXTROSE 5 % IV SOLN
500.0000 mg | INTRAVENOUS | Status: DC
  Administered 2017-04-01: 500 mg via INTRAVENOUS
  Filled 2017-04-01: qty 500

## 2017-04-01 MED ORDER — MORPHINE SULFATE ER 30 MG PO TBCR
30.0000 mg | EXTENDED_RELEASE_TABLET | ORAL | Status: DC
  Administered 2017-04-01 – 2017-04-02 (×2): 30 mg via ORAL
  Filled 2017-04-01 (×2): qty 1

## 2017-04-01 MED ORDER — PROCHLORPERAZINE MALEATE 10 MG PO TABS
10.0000 mg | ORAL_TABLET | Freq: Four times a day (QID) | ORAL | Status: DC | PRN
  Filled 2017-04-01: qty 1

## 2017-04-01 MED ORDER — DM-GUAIFENESIN ER 30-600 MG PO TB12: 1.0000 | ORAL_TABLET | Freq: Two times a day (BID) | ORAL | Status: DC

## 2017-04-01 MED ORDER — PANTOPRAZOLE SODIUM 40 MG PO TBEC
40.0000 mg | DELAYED_RELEASE_TABLET | Freq: Every day | ORAL | Status: DC
  Administered 2017-04-01 – 2017-04-02 (×2): 40 mg via ORAL
  Filled 2017-04-01 (×2): qty 1

## 2017-04-01 MED ORDER — SENNA 8.6 MG PO TABS
1.0000 | ORAL_TABLET | Freq: Every day | ORAL | Status: DC
  Administered 2017-04-01 – 2017-04-02 (×2): 8.6 mg via ORAL
  Filled 2017-04-01 (×2): qty 1

## 2017-04-01 MED ORDER — SENNOSIDES-DOCUSATE SODIUM 8.6-50 MG PO TABS: 1.0000 | ORAL_TABLET | Freq: Every evening | ORAL | Status: DC | PRN

## 2017-04-01 MED ORDER — OMEGA-3-ACID ETHYL ESTERS 1 G PO CAPS
1.0000 g | ORAL_CAPSULE | Freq: Every day | ORAL | Status: DC
  Administered 2017-04-01 – 2017-04-02 (×2): 1 g via ORAL
  Filled 2017-04-01 (×2): qty 1

## 2017-04-01 MED ORDER — DOCUSATE SODIUM 100 MG PO CAPS
100.0000 mg | ORAL_CAPSULE | Freq: Two times a day (BID) | ORAL | Status: DC
  Administered 2017-04-01 – 2017-04-02 (×3): 100 mg via ORAL
  Filled 2017-04-01 (×3): qty 1

## 2017-04-01 NOTE — Progress Notes (Signed)
Nathaniel Dixon at [place] NAME: Nathaniel Dixon    MR#:  [id number]  DATE OF BIRTH:  Dec 07, 1951  SUBJECTIVE:  CHIEF COMPLAINT:   Chief Complaint  Patient presents with  . Fever   - New diagnosis of lung cancer currently on chemotherapy, finished radiation. Admitted with fevers and worsening shortness of breath. -Remains afebrile this morning  REVIEW OF SYSTEMS:  Review of Systems  Constitutional: Positive for fever. Negative for chills and malaise/fatigue.  HENT: Negative for congestion, ear discharge, hearing loss and nosebleeds.   Eyes: Negative for blurred vision and double vision.  Respiratory: Negative for cough, shortness of breath and wheezing.   Cardiovascular: Negative for chest pain, palpitations and leg swelling.  Gastrointestinal: Negative for abdominal pain, constipation, diarrhea, nausea and vomiting.  Genitourinary: Negative for dysuria.  Musculoskeletal: Negative for myalgias.  Neurological: Negative for dizziness, sensory change, speech change, focal weakness, seizures and headaches.  Psychiatric/Behavioral: Negative for depression.    DRUG ALLERGIES:   Allergies  Allergen Reactions  . Statins Rash    Pain in legs, weakness    VITALS:  Blood pressure 111/78, pulse 72, temperature 98.3 F (36.8 C), temperature source Oral, resp. rate 18, height '6\' 3"'$  (1.905 m), weight 93.9 kg (207 lb), SpO2 97 %.  PHYSICAL EXAMINATION:  Physical Exam  GENERAL:  66 y.o.-year-old patient lying in the bed with no acute distress.  EYES: Pupils equal, round, reactive to light and accommodation. No scleral icterus. Extraocular muscles intact.  HEENT: Head atraumatic, normocephalic. Oropharynx and nasopharynx clear.  NECK:  Supple, no jugular venous distention. No thyroid enlargement, no tenderness.  LUNGS: Normal breath sounds bilaterally, no wheezing, rales,rhonchi or crepitation. No use of accessory muscles of respiration. Decreased  left upper lobe breath sounds CARDIOVASCULAR: S1, S2 normal. No murmurs, rubs, or gallops.  ABDOMEN: Soft, nontender, nondistended. Bowel sounds present. No organomegaly or mass.  EXTREMITIES: No pedal edema, cyanosis, or clubbing.  NEUROLOGIC: Cranial nerves II through XII are intact. Muscle strength 5/5 in all extremities. Sensation intact. Gait not checked.  PSYCHIATRIC: The patient is alert and oriented x 3.  SKIN: No obvious rash, lesion, or ulcer.    LABORATORY PANEL:   CBC  Recent Labs Lab 04/01/17 0058  WBC 2.9*  HGB 10.4*  HCT 29.6*  PLT 130*   ------------------------------------------------------------------------------------------------------------------  Chemistries   Recent Labs Lab 03/31/17 2056 04/01/17 0058  NA 133* 132*  K 3.3* 3.4*  CL 97* 101  CO2 25 23  GLUCOSE 124* 94  BUN 24* 22*  CREATININE 1.02 0.79  CALCIUM 8.6* 7.6*  MG 1.4*  --   AST 20  --   ALT 15*  --   ALKPHOS 55  --   BILITOT 0.6  --    ------------------------------------------------------------------------------------------------------------------  Cardiac Enzymes  Recent Labs Lab 03/31/17 2056  TROPONINI <0.03   ------------------------------------------------------------------------------------------------------------------  RADIOLOGY:  Dg Chest Port 1 View  Result Date: 03/31/2017 CLINICAL DATA:  Cough, congestion and fever with dyspnea yesterday. Patient receiving chemotherapy for lung cancer. EXAM: PORTABLE CHEST 1 VIEW COMPARISON:  11/21/2016 FINDINGS: The heart size and mediastinal contours are within normal limits. Subtle new opacity in the right upper lobe may reflect a subtle pneumonia. Pleural-parenchymal scarring in the left upper lobe. No dominant mass, effusion or pneumothorax. Aortic atherosclerosis. The visualized skeletal structures are unremarkable. IMPRESSION: Scarring in the left upper lobe. Possible subtle pneumonia in the right upper lobe. Followup PA and  lateral chest X-ray is recommended in  3-4 weeks following trial of antibiotic therapy to ensure resolution and exclude underlying malignancy. Electronically Signed   By: Ashley Royalty M.D.   On: 03/31/2017 22:33    EKG:   Orders placed or performed during the hospital encounter of 03/31/17  . EKG 12-Lead  . EKG 12-Lead  . EKG 12-Lead    ASSESSMENT AND PLAN:   66 year old male with past medical history significant for stage III non-small cell lung cancer-adenocarcinoma currently on chemoradiation, neuropathy, CK D stage III presents to hospital secondary to fever and shortness of breath.  #1 community-acquired pneumonia- right upper lobe pneumonia. With fever  - blood cultures are pending - continue rocephin and azithromycin - improving, off o2  #2 Non small cell lung cancer- stage 3, lymphatic spread and in odes. Following with [facility] - on chemo, finished 3 cycles, finished radiation recently - continue f/u with oncology as prior recommended - leukopenia is secondary to the same- monitor, improved since last chemo though -MRI of the brain was negative for metastatic disease as outpatient  #3 chronic pain-due to his cancer. Continue home medications.  #4 GERD-on Protonix  #5 depression-on Remeron  #6 DVT prophylaxis-Lovenox  #7 hypokalemia-being replaced  If fevers resolved, breathing is improved, discharged tomorrow on antibiotics     All the records are reviewed and case discussed with Care Management/Social Workerr. Management plans discussed with the patient, family and they are in agreement.  CODE STATUS: Full code  TOTAL TIME TAKING CARE OF THIS PATIENT: 38 minutes.   POSSIBLE D/C IN 1-2 DAYS, DEPENDING ON CLINICAL CONDITION.   Gladstone Lighter M.D on 04/01/2017 at 2:08 PM  Between 7am to 6pm - Pager - [phone]  After 6pm go to [email or web address] - password [email or web address] [facility]  Office  [phone]  CC: Primary care physician;  Gayland Curry, MD

## 2017-04-01 NOTE — Progress Notes (Signed)
Initial Nutrition Assessment  DOCUMENTATION CODES:   Severe malnutrition in context of chronic illness  INTERVENTION:  Recommend liberalizing diet from heart healthy to regular in setting of malnutrition and poor PO intake.  Provide Ensure Enlive po TID between meals, each supplement provides 350 kcal and 20 grams of protein.   Reviewed High-Calorie, High-Protein Nutrition Therapy and High-Calorie, High-Protein Recipes from the Academy of Nutrition and Dietetics with patient and family. Encouraged small, frequent meals with adequate calories and protein to prevent any further weight loss.  NUTRITION DIAGNOSIS:   Malnutrition (Severe) related to chronic illness (stg III lung cancer on XRT/chemo) as evidenced by energy intake < or equal to 75% for > or equal to 1 month, 13.4 percent weight loss over 4 months.  GOAL:   Patient will meet greater than or equal to 90% of their needs  MONITOR:   PO intake, Supplement acceptance, Labs, Weight trends, I & O's  REASON FOR ASSESSMENT:   Malnutrition Screening Tool    ASSESSMENT:   66 year old male with PMHx of stage III NSCLC s/p XRT and currently on chemo (cisplatin/pemetrexed), BPH, polyneuropathy, HLD, CKD III who presents due to shortness of breath found to have sepsis due to PNA.   -Of note patient reported to this RD he was finished with XRT but per last oncology note on 3/27 patient was still receiving XRT.  Spoke with patient and family members at bedside. Patient reports his appetite has been poor since January. He is attempting to eat 3 meals per day, but is not able to finish his meals. Patient reports he has a lack of hunger (anorexia), early satiety, and taste changes affecting appetite. He also reports difficulty swallowing due to XRT where he can only tolerate soft foods and liquids. Patient reports his example of breakfast this morning is about how much he is able to finish of his meals at home. He reports he had 1/4 of his  omelette and then only a few teaspoons of his oatmeal (approximately 100 kcal and 5 grams of protein). Denies N/V, abdominal pain, constipation/diarrhea. Patient reports he started chemotherapy the last week of February and that he had six weeks of XRT. Patient has tried Ensure Plus and Boost Plus at home, but he does not drink them regularly. He is amenable to trying Ensure Enlive here - encouraged regular intake. Patient reports he has not met with the RD at his cancer center yet (followed at [facility]).  Reports UBW was 250-260 lbs. Did not note weights in chart as high as reported UBW. Per chart patient was 239 lbs on 11/21/2016 and has lost 32 lbs (13.4% body weight) over approximately 4 months, which is significant for time frame.  Medications reviewed and include: azithromycin, ceftriaxone, Colace, folic acid 130 micrograms daily, Remeron 15 mg daily, Lovaza 1 gram daily, pantoprazole, potassium chloride 40 mEq BID, senna, vitamin B12 1000 micrograms daily, NS @ 100 ml/hr.  Labs reviewed: Sodium 132, Potassium 3.4, BUN 22.  Nutrition-Focused physical exam completed. Findings are moderate fat depletion, moderate muscle depletion, and no edema.   Diet Order:  Diet Heart Room service appropriate? Yes; Fluid consistency: Thin  Skin:  Reviewed, no issues  Last BM:  03/31/2017  Height:   Ht Readings from Last 1 Encounters:  04/01/17 6' 3"  (1.905 m)    Weight:   Wt Readings from Last 1 Encounters:  04/01/17 207 lb (93.9 kg)    Ideal Body Weight:  89.1 kg  BMI:  Body mass  index is 25.87 kg/m.  Estimated Nutritional Needs:   Kcal:  2345-2700 (MSJ x 1.3-1.5)  Protein:  110-140 grams (1.2-1.5 grams/kg)  Fluid:  2.3 L/day (25 ml/kg)  EDUCATION NEEDS:   Education needs addressed  Willey Blade, MS, RD, LDN Pager: [phone] After Hours Pager: [phone]

## 2017-04-01 NOTE — Evaluation (Signed)
Physical Therapy Evaluation Patient Details Name: Nathaniel Dixon MRN: [id number] DOB: 1951-04-20 Today's Date: 04/01/2017   History of Present Illness  Pt is a 66 y.o. male with a known history of stage III [place] Lung Cancer on chemo and s/p radiation, BPH, polyneuropathy, HLD, CKD3, presents to the emergency department for evaluation of shortness of breath .  Patient was in a usual state of health until three days ago when he developed shortness of breath and dyspnea on exertion associated with cough productive of clear to white sputum.    Clinical Impression  Pt is Ind with all functional mobility including amb 220' without AD with good cadence and no LOB.  Pt's vital signs pre/post amb as follows:  SpO2 95/95%, HR 95/101 bpm without SOB.  Pt able to stand in true tandem stance without LOB and stood > 10 sec with SLS on both LEs.  Will complete PT orders at this time.  Will reassess pt pending a change in status upon receipt of new PT orders.    Follow Up Recommendations No PT follow up    Equipment Recommendations  None recommended by PT    Recommendations for Other Services       Precautions / Restrictions Precautions Precautions: None Restrictions Weight Bearing Restrictions: No      Mobility  Bed Mobility Overal bed mobility: Independent                Transfers Overall transfer level: Independent                  Ambulation/Gait Ambulation/Gait assistance: Independent Ambulation Distance (Feet): 220 Feet Assistive device: None Gait Pattern/deviations: WFL(Within Functional Limits)   Gait velocity interpretation: at or above normal speed for age/gender    Stairs            Wheelchair Mobility    Modified Rankin (Stroke Patients Only)       Balance Overall balance assessment: Independent (Steady in tandem stance and achieved SLS time > 10 sec on each LE)                                           Pertinent Vitals/Pain  Pain Assessment: No/denies pain    Home Living Family/patient expects to be discharged to:: Private residence Living Arrangements: Spouse/significant other;Children Available Help at Discharge: Family;Available 24 hours/day Type of Home: Apartment Home Access: Level entry       Home Equipment: None      Prior Function Level of Independence: Independent         Comments: Ind with amb community distances with no fall history, Ind with ADLs     Hand Dominance        Extremity/Trunk Assessment   Upper Extremity Assessment Upper Extremity Assessment: Overall WFL for tasks assessed    Lower Extremity Assessment Lower Extremity Assessment: Overall WFL for tasks assessed       Communication   Communication: No difficulties  Cognition Arousal/Alertness: Awake/alert Behavior During Therapy: WFL for tasks assessed/performed Overall Cognitive Status: Within Functional Limits for tasks assessed                                        General Comments      Exercises     Assessment/Plan  PT Assessment Patent does not need any further PT services  PT Problem List         PT Treatment Interventions      PT Goals (Current goals can be found in the Care Plan section)  Acute Rehab PT Goals Patient Stated Goal: To get home PT Goal Formulation: With patient    Frequency     Barriers to discharge        Co-evaluation               End of Session Equipment Utilized During Treatment: Gait belt Activity Tolerance: Patient tolerated treatment well Patient left: in bed;with call bell/phone within reach   PT Visit Diagnosis: Muscle weakness (generalized) (M62.81)    Time: 5364-6803 PT Time Calculation (min) (ACUTE ONLY): 24 min   Charges:   PT Evaluation $PT Eval Low Complexity: 1 Procedure     PT G Codes:        DRoyetta Asal PT, DPT 04/01/17, 11:16 AM

## 2017-04-01 NOTE — ED Notes (Signed)
Pt states that he completed his chemo and radiation on Friday, states that he is being treated for lung ca in the left mid lobe, pt reports that he started feeling bad yesterday and got worse today, pt reports that he was told that if his temp was 100.5 and higher to call, pt was told by his dr to go to the ER. Pt states that his sob has increased as well, no distress noted at this time, pt's wife at bedside, pt reports productive cough at times

## 2017-04-02 LAB — URINE CULTURE: CULTURE: NO GROWTH

## 2017-04-02 LAB — BASIC METABOLIC PANEL
Anion gap: 7 (ref 5–15)
BUN: 15 mg/dL (ref 6–20)
CALCIUM: 8.2 mg/dL — AB (ref 8.9–10.3)
CO2: 24 mmol/L (ref 22–32)
CREATININE: 0.93 mg/dL (ref 0.61–1.24)
Chloride: 100 mmol/L — ABNORMAL LOW (ref 101–111)
GFR calc Af Amer: >60 mL/min (ref >60)
GFR calc non Af Amer: >60 mL/min (ref >60)
GLUCOSE: 95 mg/dL (ref 65–99)
Potassium: 4.1 mmol/L (ref 3.5–5.1)
Sodium: 131 mmol/L — ABNORMAL LOW (ref 135–145)

## 2017-04-02 LAB — LEGIONELLA PNEUMOPHILA SEROGP 1 UR AG: L. PNEUMOPHILA SEROGP 1 UR AG: NEGATIVE

## 2017-04-02 LAB — HIV ANTIBODY (ROUTINE TESTING W REFLEX): HIV SCREEN 4TH GENERATION: NONREACTIVE

## 2017-04-02 MED ORDER — CEFUROXIME AXETIL 500 MG PO TABS: 500.0000 mg | ORAL_TABLET | Freq: Two times a day (BID) | ORAL | 0 refills | Status: AC

## 2017-04-02 MED ORDER — GUAIFENESIN ER 600 MG PO TB12: 600.0000 mg | ORAL_TABLET | Freq: Two times a day (BID) | ORAL | 0 refills | Status: DC

## 2017-04-02 MED ORDER — DEXTROMETHORPHAN POLISTIREX ER 30 MG/5ML PO SUER: 30.0000 mg | Freq: Two times a day (BID) | ORAL | 0 refills | Status: DC

## 2017-04-02 MED ORDER — CEFUROXIME AXETIL 500 MG PO TABS
500.0000 mg | ORAL_TABLET | Freq: Two times a day (BID) | ORAL | Status: DC
  Administered 2017-04-02: 500 mg via ORAL
  Filled 2017-04-02: qty 1

## 2017-04-02 MED ORDER — AZITHROMYCIN 250 MG PO TABS: 250.0000 mg | ORAL_TABLET | Freq: Every day | ORAL | 0 refills | Status: AC

## 2017-04-02 MED ORDER — AZITHROMYCIN 500 MG PO TABS
250.0000 mg | ORAL_TABLET | Freq: Every day | ORAL | Status: DC
  Administered 2017-04-02: 250 mg via ORAL
  Filled 2017-04-02: qty 1

## 2017-04-02 NOTE — Discharge Summary (Addendum)
[facility] NAME: Nathaniel Dixon    MR#:  [id number]  DATE OF BIRTH:  Jul 25, 1951  DATE OF ADMISSION:  03/31/2017 ADMITTING PHYSICIAN: Harvie Bridge, DO  DATE OF DISCHARGE: 04/02/2017  PRIMARY CARE PHYSICIAN: ALDRIDGE,BARBARA, MD    ADMISSION DIAGNOSIS:  Sepsis, due to unspecified organism ([place]) [A41.9] Community acquired pneumonia of right upper lobe of lung ([place]) [J18.1]  DISCHARGE DIAGNOSIS:  Right sided PNA Non small cell Lung cancer undergoing Chemotherapy   SECONDARY DIAGNOSIS:   Past Medical History:  Diagnosis Date  . Cancer [facility])    lung    HOSPITAL COURSE:   66 year old male with past medical history significant for stage III non-small cell lung cancer-adenocarcinoma currently on chemoradiation, neuropathy, CK D stage III presents to hospital secondary to fever and shortness of breath.  #1 community-acquired pneumonia- right upper lobe pneumonia. With fever  - blood cultures are negative so far -pt remains afebrile - continue rocephin and azithromycin---change to oral abxs - improving, off o2, sats 97% on RA  #2 Non small cell lung cancer- stage 3, lymphatic spread and in odes. Following with [facility] oncology - on chemo, finished 3 cycles, finished radiation recently - continue f/u with oncology as prior recommended - leukopenia is secondary to the same- monitor, improved since last chemo though -MRI of the brain was negative for metastatic disease as outpatient  #3 chronic pain-due to his cancer. Continue home medications.  #4 GERD-on Protonix  #5 depression-on Remeron  #6 DVT prophylaxis- was on Lovenox  #7 hypokalemia-being replaced  #8 Severe protein calorie malnutrition--follow dietitians recommendations  overall stable D/c home  CONSULTS OBTAINED:    DRUG ALLERGIES:   Allergies  Allergen Reactions  . Statins Rash    Pain in legs, weakness    DISCHARGE MEDICATIONS:    Current Discharge Medication List    START taking these medications   Details  azithromycin (ZITHROMAX) 250 MG tablet Take 1 tablet (250 mg total) by mouth daily. Qty: 3 tablet, Refills: 0    cefUROXime (CEFTIN) 500 MG tablet Take 1 tablet (500 mg total) by mouth 2 (two) times daily with a meal. Qty: 12 tablet, Refills: 0    dextromethorphan (DELSYM) 30 MG/5ML liquid Take 5 mLs (30 mg total) by mouth 2 (two) times daily. Qty: 89 mL, Refills: 0    guaiFENesin (MUCINEX) 600 MG 12 hr tablet Take 1 tablet (600 mg total) by mouth 2 (two) times daily. Qty: 12 tablet, Refills: 0      CONTINUE these medications which have NOT CHANGED   Details  dexamethasone (DECADRON) 4 MG tablet Take 8 mg by mouth daily.    docusate sodium (COLACE) 100 MG capsule Take 100 mg by mouth 2 (two) times daily.    folic acid (FOLVITE) 785 MCG tablet Take 400 mcg by mouth daily.    lidocaine (XYLOCAINE) 2 % solution Use as directed 20 mLs in the mouth or throat as needed for mouth pain.    mirtazapine (REMERON) 15 MG tablet Take 15 mg by mouth at bedtime.    morphine (MS CONTIN) 30 MG 12 hr tablet Take 30 mg by mouth every morning.    OLANZapine (ZYPREXA) 5 MG tablet Take 5 mg by mouth at bedtime. During chemo    omega-3 acid ethyl esters (LOVAZA) 1 g capsule Take 1 g by mouth daily.    omeprazole (PRILOSEC) 20 MG capsule Take 20 mg by mouth 2 (two) times daily before a  meal.    prochlorperazine (COMPAZINE) 10 MG tablet Take 10 mg by mouth every 6 (six) hours as needed for nausea or vomiting.    senna (SENOKOT) 8.6 MG TABS tablet Take 1 tablet by mouth daily.    vitamin B-12 (CYANOCOBALAMIN) 1000 MCG tablet Take 1,000 mcg by mouth daily.        If you experience worsening of your admission symptoms, develop shortness of breath, life threatening emergency, suicidal or homicidal thoughts you must seek medical attention immediately by calling 911 or calling your MD immediately  if symptoms less  severe.  You Must read complete instructions/literature along with all the possible adverse reactions/side effects for all the Medicines you take and that have been prescribed to you. Take any new Medicines after you have completely understood and accept all the possible adverse reactions/side effects.   Please note  You were cared for by a hospitalist during your hospital stay. If you have any questions about your discharge medications or the care you received while you were in the hospital after you are discharged, you can call the unit and asked to speak with the hospitalist on call if the hospitalist that took care of you is not available. Once you are discharged, your primary care physician will handle any further medical issues. Please note that NO REFILLS for any discharge medications will be authorized once you are discharged, as it is imperative that you return to your primary care physician (or establish a relationship with a primary care physician if you do not have one) for your aftercare needs so that they can reassess your need for medications and monitor your lab values. Today   SUBJECTIVE   No new complaints No fever  VITAL SIGNS:  Blood pressure 107/69, pulse 71, temperature 98.1 F (36.7 C), temperature source Oral, resp. rate 20, height '6\' 3"'$  (1.905 m), weight 93.9 kg (207 lb), SpO2 97 %.  I/O:   Intake/Output Summary (Last 24 hours) at 04/02/17 0753 Last data filed at 04/02/17 0310  Gross per 24 hour  Intake              540 ml  Output                0 ml  Net              540 ml    PHYSICAL EXAMINATION:  GENERAL:  66 y.o.-year-old patient lying in the bed with no acute distress.  EYES: Pupils equal, round, reactive to light and accommodation. No scleral icterus. Extraocular muscles intact.  HEENT: Head atraumatic, normocephalic. Oropharynx and nasopharynx clear.  NECK:  Supple, no jugular venous distention. No thyroid enlargement, no tenderness.  LUNGS: Normal  breath sounds bilaterally, no wheezing, rales,rhonchi or crepitation. No use of accessory muscles of respiration.  CARDIOVASCULAR: S1, S2 normal. No murmurs, rubs, or gallops.  ABDOMEN: Soft, non-tender, non-distended. Bowel sounds present. No organomegaly or mass.  EXTREMITIES: No pedal edema, cyanosis, or clubbing.  NEUROLOGIC: Cranial nerves II through XII are intact. Muscle strength 5/5 in all extremities. Sensation intact. Gait not checked.  PSYCHIATRIC: The patient is alert and oriented x 3.  SKIN: No obvious rash, lesion, or ulcer.   DATA REVIEW:   CBC   Recent Labs Lab 04/01/17 0058  WBC 2.9*  HGB 10.4*  HCT 29.6*  PLT 130*    Chemistries   Recent Labs Lab 03/31/17 2056  04/02/17 0331  NA 133*  < > 131*  K 3.3*  < >  4.1  CL 97*  < > 100*  CO2 25  < > 24  GLUCOSE 124*  < > 95  BUN 24*  < > 15  CREATININE 1.02  < > 0.93  CALCIUM 8.6*  < > 8.2*  MG 1.4*  --   --   AST 20  --   --   ALT 15*  --   --   ALKPHOS 55  --   --   BILITOT 0.6  --   --   < > = values in this interval not displayed.  Microbiology Results   Recent Results (from the past 240 hour(s))  Blood Culture (routine x 2)     Status: None (Preliminary result)   Collection Time: 03/31/17  8:56 PM  Result Value Ref Range Status   Specimen Description BLOOD RIGHT HAND  Final   Special Requests   Final    BOTTLES DRAWN AEROBIC AND ANAEROBIC Blood Culture results may not be optimal due to an inadequate volume of blood received in culture bottles   Culture NO GROWTH < 12 HOURS  Final   Report Status PENDING  Incomplete  Blood Culture (routine x 2)     Status: None (Preliminary result)   Collection Time: 03/31/17  8:57 PM  Result Value Ref Range Status   Specimen Description BLOOD LT [facility]  Final   Special Requests Blood Culture adequate volume  Final   Culture NO GROWTH < 12 HOURS  Final   Report Status PENDING  Incomplete    RADIOLOGY:  Dg Chest Port 1 View  Result Date: 03/31/2017 CLINICAL  DATA:  Cough, congestion and fever with dyspnea yesterday. Patient receiving chemotherapy for lung cancer. EXAM: PORTABLE CHEST 1 VIEW COMPARISON:  11/21/2016 FINDINGS: The heart size and mediastinal contours are within normal limits. Subtle new opacity in the right upper lobe may reflect a subtle pneumonia. Pleural-parenchymal scarring in the left upper lobe. No dominant mass, effusion or pneumothorax. Aortic atherosclerosis. The visualized skeletal structures are unremarkable. IMPRESSION: Scarring in the left upper lobe. Possible subtle pneumonia in the right upper lobe. Followup PA and lateral chest X-ray is recommended in 3-4 weeks following trial of antibiotic therapy to ensure resolution and exclude underlying malignancy. Electronically Signed   By: Ashley Royalty M.D.   On: 03/31/2017 22:33     Management plans discussed with the patient, family and they are in agreement.  CODE STATUS:     Code Status Orders        Start     Ordered   04/01/17 0032  Full code  Continuous     04/01/17 0031    Code Status History    Date Active Date Inactive Code Status Order ID Comments User Context   This patient has a current code status but no historical code status.      TOTAL TIME TAKING CARE OF THIS PATIENT: 40 minutes.    Jeda Pardue M.D on 04/02/2017 at 7:53 AM  Between 7am to 6pm - Pager - [phone] After 6pm go to [email or web address] - password [email or web address] [facility]  Office  [phone]  CC: Primary care physician; Gayland Curry, MD

## 2017-04-02 NOTE — Discharge Instructions (Signed)
f/u [facility] oncology per schedule

## 2017-04-02 NOTE — Progress Notes (Signed)
Pt is d/ced home.  He states that he is feeling much better.  Lung sounds improved.  Yesterday he was having inspiratory wheezes.  Today he is clear but diminished.  Pt continues to have productive cough - coughing up clear/yellow thick sputum.  He will go home with cough syrup and mucinex.  Plan is to repeat  Chest xray in a couple of weeks after course of abx has run, to r/o malignancy.  Patient is experiencing constipation possibly from chemo and poor appetite - Discussed some ideas for good laxatives and to increase fluid intake and fiber in diet. Will review f/u appts and new medications.  Patient will go home with wife.

## 2017-04-05 LAB — CULTURE, BLOOD (ROUTINE X 2)
CULTURE: NO GROWTH
Culture: NO GROWTH
Special Requests: ADEQUATE

## 2017-10-10 ENCOUNTER — Emergency Department
Admission: EM | Admit: 2017-10-10 | Discharge: 2017-10-10 | Disposition: A | Discharge status: Home / Self Care | Payer: Medicare Other | Attending: Emergency Medicine | Admitting: Emergency Medicine

## 2017-10-10 ENCOUNTER — Emergency Department: Payer: Medicare Other

## 2017-10-10 DIAGNOSIS — Z87891 Personal history of nicotine dependence: Secondary | ICD-10-CM | POA: Insufficient documentation

## 2017-10-10 DIAGNOSIS — W2209XA Striking against other stationary object, initial encounter: Secondary | ICD-10-CM | POA: Diagnosis not present

## 2017-10-10 DIAGNOSIS — Z79899 Other long term (current) drug therapy: Secondary | ICD-10-CM | POA: Diagnosis not present

## 2017-10-10 DIAGNOSIS — S91209A Unspecified open wound of unspecified toe(s) with damage to nail, initial encounter: Secondary | ICD-10-CM | POA: Diagnosis present

## 2017-10-10 DIAGNOSIS — Y929 Unspecified place or not applicable: Secondary | ICD-10-CM | POA: Diagnosis not present

## 2017-10-10 DIAGNOSIS — Y939 Activity, unspecified: Secondary | ICD-10-CM | POA: Insufficient documentation

## 2017-10-10 DIAGNOSIS — S93402A Sprain of unspecified ligament of left ankle, initial encounter: Secondary | ICD-10-CM | POA: Insufficient documentation

## 2017-10-10 DIAGNOSIS — Z85118 Personal history of other malignant neoplasm of bronchus and lung: Secondary | ICD-10-CM | POA: Diagnosis not present

## 2017-10-10 DIAGNOSIS — Y999 Unspecified external cause status: Secondary | ICD-10-CM | POA: Insufficient documentation

## 2017-10-10 MED ORDER — LIDOCAINE HCL (PF) 1 % IJ SOLN
INTRAMUSCULAR | Status: AC
  Administered 2017-10-10: 09:00:00
  Filled 2017-10-10: qty 5

## 2017-10-10 MED ORDER — TRAMADOL HCL 50 MG PO TABS: 50.0000 mg | ORAL_TABLET | Freq: Four times a day (QID) | ORAL | 0 refills | Status: DC | PRN

## 2017-10-10 MED ORDER — BACITRACIN ZINC 500 UNIT/GM EX OINT
TOPICAL_OINTMENT | Freq: Two times a day (BID) | CUTANEOUS | Status: DC
  Filled 2017-10-10: qty 0.9

## 2017-10-10 NOTE — ED Provider Notes (Signed)
[facility] Emergency Department Provider Note   ____________________________________________   First MD Initiated Contact with Patient 10/10/17 [phone]     (approximate)  I have reviewed the triage vital signs and the nursing notes.   HISTORY  Chief Complaint Toe Injury (Stubbed toe, thinks he may have also sprained ankle)    HPI Nathaniel Dixon is a 66 y.o. male patient presents with left ankle pain, edema, and partial avulsion of the great toenail. Patient started his total) ankle on a carpeted track strip last night. Patient rates his pain as a 7/10. Patient describes pain as "acute aching". No palliative measures for complaint.   Past Medical History:  Diagnosis Date  . Cancer [facility])    lung    Patient Active Problem List   Diagnosis Date Noted  . Sepsis due to pneumonia ([place]) 03/31/2017    Past Surgical History:  Procedure Laterality Date  . FINGER SURGERY Left     Prior to Admission medications   Medication Sig Start Date End Date Taking? Authorizing Provider  dexamethasone (DECADRON) 4 MG tablet Take 8 mg by mouth daily.    [provider]  dextromethorphan (DELSYM) 30 MG/5ML liquid Take 5 mLs (30 mg total) by mouth 2 (two) times daily. 04/02/17   Fritzi Mandes, MD  docusate sodium (COLACE) 100 MG capsule Take 100 mg by mouth 2 (two) times daily.    [provider]  folic acid (FOLVITE) 119 MCG tablet Take 400 mcg by mouth daily.    [provider]  guaiFENesin (MUCINEX) 600 MG 12 hr tablet Take 1 tablet (600 mg total) by mouth 2 (two) times daily. 04/02/17   Fritzi Mandes, MD  lidocaine (XYLOCAINE) 2 % solution Use as directed 20 mLs in the mouth or throat as needed for mouth pain.    [provider]  mirtazapine (REMERON) 15 MG tablet Take 15 mg by mouth at bedtime.    [provider]  morphine (MS CONTIN) 30 MG 12 hr tablet Take 30 mg by mouth every morning.    [provider]  OLANZapine  (ZYPREXA) 5 MG tablet Take 5 mg by mouth at bedtime. During chemo    [provider]  omega-3 acid ethyl esters (LOVAZA) 1 g capsule Take 1 g by mouth daily.    [provider]  omeprazole (PRILOSEC) 20 MG capsule Take 20 mg by mouth 2 (two) times daily before a meal.    [provider]  prochlorperazine (COMPAZINE) 10 MG tablet Take 10 mg by mouth every 6 (six) hours as needed for nausea or vomiting.    [provider]  senna (SENOKOT) 8.6 MG TABS tablet Take 1 tablet by mouth daily.    [provider]  traMADol (ULTRAM) 50 MG tablet Take 1 tablet (50 mg total) by mouth every 6 (six) hours as needed. 10/10/17 10/10/18  Sable Feil, PA-C  vitamin B-12 (CYANOCOBALAMIN) 1000 MCG tablet Take 1,000 mcg by mouth daily.    [provider]    Allergies Statins  History reviewed. No pertinent family history.  Social History Social History  Substance Use Topics  . Smoking status: Former Research scientist (life sciences)  . Smokeless tobacco: Never Used  . Alcohol use No     Comment: occ    Review of Systems Constitutional: No fever/chills Eyes: No visual changes. ENT: No sore throat. Cardiovascular: Denies chest pain. Respiratory: Denies shortness of breath. Gastrointestinal: No abdominal pain.  No nausea, no vomiting.  No diarrhea.  No constipation. Genitourinary: Negative for dysuria. Musculoskeletal: Negative for back pain. Skin: Negative for rash. Neurological: Negative for headaches, focal weakness or numbness. Hematological/Lymphatic:Non-small cell lung cancer Allergic/Immunilogical: Statins ____________________________________________   PHYSICAL EXAM:  VITAL SIGNS: ED Triage Vitals  Enc Vitals Group     BP 10/10/17 0806 127/75     Pulse Rate 10/10/17 0806 89     Resp 10/10/17 0806 18     Temp 10/10/17 0806 97.7 F (36.5 C)     Temp Source 10/10/17 0806 Oral     SpO2 10/10/17 0806 94 %     Weight 10/10/17 0807 200 lb (90.7 kg)     Height  10/10/17 0807 6\' 3"  (1.905 m)     Head Circumference --      Peak Flow --      Pain Score 10/10/17 0823 7     Pain Loc --      Pain Edu? --      Excl. in [place]? --    Constitutional: Alert and oriented. Well appearing and in no acute distress. Cardiovascular: Normal rate, regular rhythm. Grossly normal heart sounds.  Good peripheral circulation. Respiratory: Normal respiratory effort.  No retractions. Lungs CTAB. Musculoskeletal: No obvious ankle deformity. Mild edema lateral aspect of the ankle. Neurologic:  Normal speech and language. No gross focal neurologic deficits are appreciated. No gait instability. Skin:  Skin is warm, dry and intact. No rash noted. Partial avulsion to left great toenail. Psychiatric: Mood and affect are normal. Speech and behavior are normal.  ____________________________________________   LABS (all labs ordered are listed, but only abnormal results are displayed)  Labs Reviewed - No data to display ____________________________________________  EKG   ____________________________________________  RADIOLOGY  Dg Ankle Complete Left  Result Date: 10/10/2017 CLINICAL DATA:  Injured toe.  Sprained ankle.  Swelling and pain. EXAM: LEFT ANKLE COMPLETE - 3+ VIEW COMPARISON:  None available for comparison. FINDINGS: Soft tissue swelling over the lateral malleolus. Corticated bony densities noted adjacent to the lateral malleolus most consistent with old fracture fragments. No acute abnormality identified. Degenerative changes left ankle. Calcaneal spurring. IMPRESSION: 1. Soft tissue swelling noted over the lateral malleolus. 2. Corticated bony densities noted adjacent to the lateral malleolus consistent with old fracture fragments. No acute bony abnormality identified. 3. Degenerative changes left ankle.  Calcaneal spurring. Electronically Signed   By: Marcello Moores  Register   On: 10/10/2017 09:09     ____________________________________________   PROCEDURES  Procedure(s) performed: toenail removal  Procedures  Critical Care performed: No  ____________________________________________   INITIAL IMPRESSION / ASSESSMENT AND PLAN / ED COURSE  As part of my medical decision making, I reviewed the following data within the [facility]    Patient presents with pain and swelling to left lateral ankle and partial great toe avulsion. Discussed x-ray findings the patient. Patient given a digital block and great toenail was completely removed. Patient placed in an ankle splint and given discharge care instruction. Patient advised to take tramadol as needed for pain. Patient advised follow up PCP.      ____________________________________________   FINAL CLINICAL IMPRESSION(S) / ED DIAGNOSES  Final diagnoses:  Moderate left ankle sprain, initial encounter  Toenail avulsion, initial encounter      NEW MEDICATIONS STARTED DURING THIS VISIT:  New Prescriptions   TRAMADOL (ULTRAM) 50 MG TABLET    Take 1 tablet (50 mg total) by mouth every 6 (six) hours as needed.     Note:  This document was prepared  using Systems analyst and may include unintentional dictation errors.    Sable Feil, PA-C 10/10/17 [place], MD 10/10/17 1501

## 2017-10-10 NOTE — ED Triage Notes (Signed)
Patient states he stubbed toe last night on carpet tack strip and thinks he may has also sprained ankle due to ankle pain and swelling

## 2018-08-13 ENCOUNTER — Other Ambulatory Visit: Payer: Self-pay

## 2018-08-13 ENCOUNTER — Emergency Department: Payer: Medicare Other

## 2018-08-13 ENCOUNTER — Encounter: Payer: Self-pay | Admitting: Emergency Medicine

## 2018-08-13 ENCOUNTER — Inpatient Hospital Stay
Admission: EM | Admit: 2018-08-13 | Discharge: 2018-08-17 | DRG: 388 | Disposition: A | Discharge status: Other Acute Inpatient Hospital | Payer: Medicare Other | Attending: Internal Medicine | Admitting: Internal Medicine

## 2018-08-13 DIAGNOSIS — R109 Unspecified abdominal pain: Secondary | ICD-10-CM

## 2018-08-13 DIAGNOSIS — Z4659 Encounter for fitting and adjustment of other gastrointestinal appliance and device: Secondary | ICD-10-CM

## 2018-08-13 DIAGNOSIS — I493 Ventricular premature depolarization: Secondary | ICD-10-CM | POA: Diagnosis present

## 2018-08-13 DIAGNOSIS — Z888 Allergy status to other drugs, medicaments and biological substances status: Secondary | ICD-10-CM

## 2018-08-13 DIAGNOSIS — Z66 Do not resuscitate: Secondary | ICD-10-CM | POA: Diagnosis present

## 2018-08-13 DIAGNOSIS — J9621 Acute and chronic respiratory failure with hypoxia: Secondary | ICD-10-CM | POA: Diagnosis present

## 2018-08-13 DIAGNOSIS — R188 Other ascites: Secondary | ICD-10-CM | POA: Diagnosis present

## 2018-08-13 DIAGNOSIS — I472 Ventricular tachycardia: Secondary | ICD-10-CM | POA: Diagnosis present

## 2018-08-13 DIAGNOSIS — K56600 Partial intestinal obstruction, unspecified as to cause: Secondary | ICD-10-CM | POA: Diagnosis not present

## 2018-08-13 DIAGNOSIS — Z800 Family history of malignant neoplasm of digestive organs: Secondary | ICD-10-CM

## 2018-08-13 DIAGNOSIS — N179 Acute kidney failure, unspecified: Secondary | ICD-10-CM | POA: Diagnosis present

## 2018-08-13 DIAGNOSIS — C7989 Secondary malignant neoplasm of other specified sites: Secondary | ICD-10-CM | POA: Diagnosis present

## 2018-08-13 DIAGNOSIS — N181 Chronic kidney disease, stage 1: Secondary | ICD-10-CM | POA: Diagnosis present

## 2018-08-13 DIAGNOSIS — D61818 Other pancytopenia: Secondary | ICD-10-CM | POA: Diagnosis present

## 2018-08-13 DIAGNOSIS — K920 Hematemesis: Secondary | ICD-10-CM | POA: Diagnosis present

## 2018-08-13 DIAGNOSIS — J189 Pneumonia, unspecified organism: Secondary | ICD-10-CM | POA: Diagnosis present

## 2018-08-13 DIAGNOSIS — Z9221 Personal history of antineoplastic chemotherapy: Secondary | ICD-10-CM

## 2018-08-13 DIAGNOSIS — C3490 Malignant neoplasm of unspecified part of unspecified bronchus or lung: Secondary | ICD-10-CM | POA: Diagnosis present

## 2018-08-13 DIAGNOSIS — Y842 Radiological procedure and radiotherapy as the cause of abnormal reaction of the patient, or of later complication, without mention of misadventure at the time of the procedure: Secondary | ICD-10-CM | POA: Diagnosis present

## 2018-08-13 DIAGNOSIS — Z9981 Dependence on supplemental oxygen: Secondary | ICD-10-CM | POA: Diagnosis not present

## 2018-08-13 DIAGNOSIS — Z923 Personal history of irradiation: Secondary | ICD-10-CM

## 2018-08-13 DIAGNOSIS — K219 Gastro-esophageal reflux disease without esophagitis: Secondary | ICD-10-CM | POA: Diagnosis present

## 2018-08-13 DIAGNOSIS — E785 Hyperlipidemia, unspecified: Secondary | ICD-10-CM | POA: Diagnosis present

## 2018-08-13 DIAGNOSIS — T451X5A Adverse effect of antineoplastic and immunosuppressive drugs, initial encounter: Secondary | ICD-10-CM | POA: Diagnosis present

## 2018-08-13 DIAGNOSIS — Z801 Family history of malignant neoplasm of trachea, bronchus and lung: Secondary | ICD-10-CM

## 2018-08-13 DIAGNOSIS — K56609 Unspecified intestinal obstruction, unspecified as to partial versus complete obstruction: Secondary | ICD-10-CM | POA: Diagnosis present

## 2018-08-13 DIAGNOSIS — Z87891 Personal history of nicotine dependence: Secondary | ICD-10-CM | POA: Diagnosis not present

## 2018-08-13 DIAGNOSIS — E860 Dehydration: Secondary | ICD-10-CM | POA: Diagnosis present

## 2018-08-13 LAB — CBC WITH DIFFERENTIAL/PLATELET
BASOS ABS: 0 10*3/uL (ref 0–0.1)
Basophils Relative: 0 %
Eosinophils Absolute: 0 10*3/uL (ref 0–0.7)
Eosinophils Relative: 0 %
HEMATOCRIT: 36.3 % — AB (ref 40.0–52.0)
HEMOGLOBIN: 12.6 g/dL — AB (ref 13.0–18.0)
LYMPHS PCT: 27 %
Lymphs Abs: 1.4 10*3/uL (ref 1.0–3.6)
MCH: 35 pg — ABNORMAL HIGH (ref 26.0–34.0)
MCHC: 34.9 g/dL (ref 32.0–36.0)
MCV: 100.5 fL — AB (ref 80.0–100.0)
Monocytes Absolute: 0.3 10*3/uL (ref 0.2–1.0)
Monocytes Relative: 5 %
NEUTROS ABS: 3.4 10*3/uL (ref 1.4–6.5)
NEUTROS PCT: 68 %
Platelets: 91 10*3/uL — ABNORMAL LOW (ref 150–440)
RBC: 3.61 MIL/uL — AB (ref 4.40–5.90)
RDW: 14.6 % — ABNORMAL HIGH (ref 11.5–14.5)
WBC: 5.1 10*3/uL (ref 3.8–10.6)

## 2018-08-13 LAB — MAGNESIUM: Magnesium: 1.8 mg/dL (ref 1.7–2.4)

## 2018-08-13 LAB — COMPREHENSIVE METABOLIC PANEL
ALT: 16 U/L (ref 0–44)
ANION GAP: 18 — AB (ref 5–15)
AST: 37 U/L (ref 15–41)
Albumin: 3 g/dL — ABNORMAL LOW (ref 3.5–5.0)
Alkaline Phosphatase: 68 U/L (ref 38–126)
BILIRUBIN TOTAL: 1 mg/dL (ref 0.3–1.2)
BUN: 41 mg/dL — ABNORMAL HIGH (ref 8–23)
CO2: 24 mmol/L (ref 22–32)
Calcium: 9.4 mg/dL (ref 8.9–10.3)
Chloride: 95 mmol/L — ABNORMAL LOW (ref 98–111)
Creatinine, Ser: 1.4 mg/dL — ABNORMAL HIGH (ref 0.61–1.24)
GFR calc Af Amer: 58 mL/min — ABNORMAL LOW (ref >60)
GFR, EST NON AFRICAN AMERICAN: 50 mL/min — AB (ref >60)
Glucose, Bld: 176 mg/dL — ABNORMAL HIGH (ref 70–99)
POTASSIUM: 4.3 mmol/L (ref 3.5–5.1)
Sodium: 137 mmol/L (ref 135–145)
TOTAL PROTEIN: 7.5 g/dL (ref 6.5–8.1)

## 2018-08-13 LAB — TROPONIN I: Troponin I: <0.03 ng/mL (ref <0.03)

## 2018-08-13 LAB — BRAIN NATRIURETIC PEPTIDE: B Natriuretic Peptide: 123 pg/mL — ABNORMAL HIGH (ref 0.0–100.0)

## 2018-08-13 MED ORDER — ALBUTEROL SULFATE (2.5 MG/3ML) 0.083% IN NEBU
2.5000 mg | INHALATION_SOLUTION | RESPIRATORY_TRACT | Status: DC | PRN
  Administered 2018-08-13: 2.5 mg via RESPIRATORY_TRACT
  Filled 2018-08-13: qty 3

## 2018-08-13 MED ORDER — SENNOSIDES-DOCUSATE SODIUM 8.6-50 MG PO TABS: 1.0000 | ORAL_TABLET | Freq: Every evening | ORAL | Status: DC | PRN

## 2018-08-13 MED ORDER — MORPHINE SULFATE (PF) 4 MG/ML IV SOLN
4.0000 mg | INTRAVENOUS | Status: DC | PRN
  Administered 2018-08-15 – 2018-08-16 (×2): 4 mg via INTRAVENOUS
  Filled 2018-08-13 (×2): qty 1

## 2018-08-13 MED ORDER — BISACODYL 5 MG PO TBEC: 5.0000 mg | DELAYED_RELEASE_TABLET | Freq: Every day | ORAL | Status: DC | PRN

## 2018-08-13 MED ORDER — POTASSIUM CHLORIDE IN NACL 20-0.9 MEQ/L-% IV SOLN
INTRAVENOUS | Status: DC
  Administered 2018-08-13 – 2018-08-16 (×4): via INTRAVENOUS
  Filled 2018-08-13 (×7): qty 1000

## 2018-08-13 MED ORDER — MORPHINE SULFATE (PF) 2 MG/ML IV SOLN
2.0000 mg | Freq: Once | INTRAVENOUS | Status: AC
  Administered 2018-08-13: 2 mg via INTRAVENOUS
  Filled 2018-08-13: qty 1

## 2018-08-13 MED ORDER — MORPHINE SULFATE (PF) 4 MG/ML IV SOLN: 6.0000 mg | Freq: Once | INTRAVENOUS | Status: DC

## 2018-08-13 MED ORDER — HYDROCODONE-ACETAMINOPHEN 5-325 MG PO TABS
1.0000 | ORAL_TABLET | ORAL | Status: DC | PRN
Start: 2018-08-13 — End: 2018-08-17
  Administered 2018-08-14 (×2): 2 via ORAL
  Administered 2018-08-14: 1 via ORAL
  Administered 2018-08-15 (×3): 2 via ORAL
  Administered 2018-08-15: 1 via ORAL
  Administered 2018-08-16 – 2018-08-17 (×5): 2 via ORAL
  Filled 2018-08-13: qty 1
  Filled 2018-08-13 (×9): qty 2
  Filled 2018-08-13: qty 1
  Filled 2018-08-13: qty 2

## 2018-08-13 MED ORDER — ACETAMINOPHEN 650 MG RE SUPP: 650.0000 mg | Freq: Four times a day (QID) | RECTAL | Status: DC | PRN

## 2018-08-13 MED ORDER — IPRATROPIUM-ALBUTEROL 0.5-2.5 (3) MG/3ML IN SOLN
3.0000 mL | Freq: Once | RESPIRATORY_TRACT | Status: AC
  Administered 2018-08-13: 3 mL via RESPIRATORY_TRACT
  Filled 2018-08-13: qty 3

## 2018-08-13 MED ORDER — ONDANSETRON HCL 4 MG/2ML IJ SOLN
4.0000 mg | Freq: Four times a day (QID) | INTRAMUSCULAR | Status: DC | PRN
  Administered 2018-08-15 – 2018-08-16 (×2): 4 mg via INTRAVENOUS
  Filled 2018-08-13 (×2): qty 2

## 2018-08-13 MED ORDER — ONDANSETRON HCL 4 MG/2ML IJ SOLN
4.0000 mg | Freq: Once | INTRAMUSCULAR | Status: AC
  Administered 2018-08-13: 4 mg via INTRAVENOUS
  Filled 2018-08-13: qty 2

## 2018-08-13 MED ORDER — IOPAMIDOL (ISOVUE-370) INJECTION 76%
100.0000 mL | Freq: Once | INTRAVENOUS | Status: AC | PRN
  Administered 2018-08-13: 100 mL via INTRAVENOUS

## 2018-08-13 MED ORDER — PROMETHAZINE HCL 25 MG/ML IJ SOLN
12.5000 mg | Freq: Once | INTRAMUSCULAR | Status: AC
  Administered 2018-08-13: 12.5 mg via INTRAVENOUS
  Filled 2018-08-13: qty 1

## 2018-08-13 MED ORDER — BISACODYL 10 MG RE SUPP
10.0000 mg | Freq: Every day | RECTAL | Status: DC
  Administered 2018-08-13 – 2018-08-16 (×3): 10 mg via RECTAL
  Filled 2018-08-13 (×3): qty 1

## 2018-08-13 MED ORDER — ONDANSETRON HCL 4 MG PO TABS: 4.0000 mg | ORAL_TABLET | Freq: Four times a day (QID) | ORAL | Status: DC | PRN

## 2018-08-13 MED ORDER — FLEET ENEMA 7-19 GM/118ML RE ENEM: 1.0000 | ENEMA | Freq: Every day | RECTAL | Status: DC | PRN

## 2018-08-13 MED ORDER — IOPAMIDOL (ISOVUE-300) INJECTION 61%
30.0000 mL | Freq: Once | INTRAVENOUS | Status: AC | PRN
  Administered 2018-08-13: 30 mL via ORAL

## 2018-08-13 MED ORDER — FAMOTIDINE IN NACL 20-0.9 MG/50ML-% IV SOLN
20.0000 mg | Freq: Once | INTRAVENOUS | Status: AC
  Administered 2018-08-13: 20 mg via INTRAVENOUS
  Filled 2018-08-13: qty 50

## 2018-08-13 MED ORDER — IPRATROPIUM-ALBUTEROL 0.5-2.5 (3) MG/3ML IN SOLN
3.0000 mL | Freq: Four times a day (QID) | RESPIRATORY_TRACT | Status: DC
  Administered 2018-08-13 – 2018-08-17 (×14): 3 mL via RESPIRATORY_TRACT
  Filled 2018-08-13 (×14): qty 3

## 2018-08-13 MED ORDER — FAMOTIDINE IN NACL 20-0.9 MG/50ML-% IV SOLN
20.0000 mg | Freq: Two times a day (BID) | INTRAVENOUS | Status: DC
  Administered 2018-08-13 – 2018-08-16 (×7): 20 mg via INTRAVENOUS
  Filled 2018-08-13 (×7): qty 50

## 2018-08-13 MED ORDER — ACETAMINOPHEN 325 MG PO TABS: 650.0000 mg | ORAL_TABLET | Freq: Four times a day (QID) | ORAL | Status: DC | PRN

## 2018-08-13 MED ORDER — SODIUM CHLORIDE 0.9 % IV BOLUS
1000.0000 mL | Freq: Once | INTRAVENOUS | Status: AC
  Administered 2018-08-13: 1000 mL via INTRAVENOUS

## 2018-08-13 NOTE — ED Notes (Signed)
CT notified that patient could only drink 1 bottle of contrast, MD aware, per MD pt to go for CT.

## 2018-08-13 NOTE — ED Notes (Signed)
Pt had run of PVC's. Printed off and given to Dr. Bridgett Larsson.

## 2018-08-13 NOTE — Progress Notes (Signed)
Advanced Care Plan.  Purpose of Encounter: CODE STATUS. Parties in Attendance: The patient, his daughter in the me. Patient's Decisional Capacity: Yes. Medical Story: Nathaniel Dixon  is a 67 y.o. 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  He has been on home oxygen 3 L due to pneumonitis secondary to radiation to chemotherapy.  He is being admitted for partial bowel obstruction and acute on chronic respiratory failure.  I discussed with the patient and his daughter about the patient's current condition, poor prognosis and CODE STATUS.  The patient stated that he wants to be resuscitated and intubated if necessary. Plan:  Code Status: Full code. Time spent discussing advance care planning: 18 minutes.

## 2018-08-13 NOTE — H&P (Addendum)
[facility] NAME: Nathaniel Dixon    MR#:  [id number]  DATE OF BIRTH:  05-30-1951  DATE OF ADMISSION:  08/13/2018  PRIMARY CARE PHYSICIAN: Gayland Curry, MD   REQUESTING/REFERRING PHYSICIAN: Dr. Rip Harbour.  CHIEF COMPLAINT:   Chief Complaint  Patient presents with  . Shortness of Breath   Worsening shortness of breath, abdominal pain and distention and constipation. HISTORY OF PRESENT ILLNESS:  Nathaniel Dixon  is a 67 y.o. 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  He has been on home oxygen 3 L due to pneumonitis secondary to radiation to chemotherapy. He is following [facility] oncologist for chemotherapy.  He has had abdominal pain, distention and constipation for several days, worsening today.  He has nausea and vomiting.  The vomitus occult test is positive. He also complains of worsening shortness of breath, he is put on oxygen 4 L.  CT of abdomen show partial small bowel obstruction.  As per Dr. Rip Harbour, the patient was accepted by [facility] oncologist Dr. Tasia Catchings but no bed is available at this time.  The patient is on waiting list.  He requested admission to hospitalist's service. PAST MEDICAL HISTORY:   Past Medical History:  Diagnosis Date  . Cancer ([place])    lung    PAST SURGICAL HISTORY:   Past Surgical History:  Procedure Laterality Date  . FINGER SURGERY Left     SOCIAL HISTORY:   Social History   Tobacco Use  . Smoking status: Former Research scientist (life sciences)  . Smokeless tobacco: Never Used  Substance Use Topics  . Alcohol use: No    Comment: occ    FAMILY HISTORY:   Family History  Problem Relation Age of Onset  . Lung cancer Father   . Colon cancer Brother     DRUG ALLERGIES:   Allergies  Allergen Reactions  . Statins Rash    Pain in legs, weakness Pain in legs, weakness    REVIEW OF SYSTEMS:   Review of Systems    Constitutional: Positive for malaise/fatigue. Negative for chills and fever.  HENT: Negative for sore throat.   Eyes: Negative for blurred vision and double vision.  Respiratory: Positive for cough and shortness of breath. Negative for hemoptysis, sputum production, wheezing and stridor.   Cardiovascular: Negative for chest pain, palpitations, orthopnea and leg swelling.  Gastrointestinal: Positive for abdominal pain, constipation, nausea and vomiting. Negative for blood in stool, diarrhea and melena.  Genitourinary: Negative for dysuria, flank pain and hematuria.  Musculoskeletal: Negative for back pain and joint pain.  Skin: Negative for rash.  Neurological: Negative for dizziness, sensory change, focal weakness, seizures, loss of consciousness, weakness and headaches.  Endo/Heme/Allergies: Negative for polydipsia.  Psychiatric/Behavioral: Negative for depression. The patient is not nervous/anxious.     MEDICATIONS AT HOME:   Prior to Admission medications   Medication Sig Start Date End Date Taking? Authorizing Provider  albuterol (PROVENTIL HFA;VENTOLIN HFA) 108 (90 Base) MCG/ACT inhaler Inhale 2 puffs into the lungs every 6 (six) hours as needed for wheezing or shortness of breath.   Yes [provider]  docusate sodium (COLACE) 100 MG capsule Take 100 mg by mouth 2 (two) times daily.   Yes [provider]  HYDROcodone-homatropine (HYCODAN) 5-1.5 MG/5ML syrup Take 5 mLs by mouth every 6 (six) hours as needed for cough.   Yes [provider]  HYDROmorphone (DILAUDID) 2 MG  tablet Take 2 mg by mouth every 3 (three) hours as needed for severe pain.   Yes [provider]  lactulose (CHRONULAC) 10 GM/15ML solution Take 30 mLs by mouth 3 (three) times daily as needed. 07/31/18  Yes [provider]  lidocaine (XYLOCAINE) 2 % solution Use as directed 20 mLs in the mouth or throat as needed for mouth pain.   Yes [provider]  mirtazapine  (REMERON) 15 MG tablet Take 15 mg by mouth at bedtime.   Yes [provider]  Morphine Sulfate ER 15 MG T12A Take 15 mg by mouth every 12 (twelve) hours.    Yes [provider]  omega-3 acid ethyl esters (LOVAZA) 1 g capsule Take 1 g by mouth daily.   Yes [provider]  omeprazole (PRILOSEC) 20 MG capsule Take 20 mg by mouth 2 (two) times daily before a meal.   Yes [provider]  oxyCODONE (OXY IR/ROXICODONE) 5 MG immediate release tablet Take 5-10 mg by mouth every 4 (four) hours as needed for severe pain.   Yes [provider]  predniSONE (DELTASONE) 10 MG tablet Take 10 mg by mouth every morning.   Yes [provider]  prochlorperazine (COMPAZINE) 10 MG tablet Take 10 mg by mouth every 6 (six) hours as needed for nausea or vomiting.   Yes [provider]  senna (SENOKOT) 8.6 MG TABS tablet Take 1 tablet by mouth daily.   Yes [provider]  dextromethorphan (DELSYM) 30 MG/5ML liquid Take 5 mLs (30 mg total) by mouth 2 (two) times daily. Patient not taking: Reported on 08/13/2018 04/02/17   Fritzi Mandes, MD  guaiFENesin (MUCINEX) 600 MG 12 hr tablet Take 1 tablet (600 mg total) by mouth 2 (two) times daily. Patient not taking: Reported on 08/13/2018 04/02/17   Fritzi Mandes, MD      VITAL SIGNS:  Blood pressure 99/80, pulse 87, temperature (!) 97.4 F (36.3 C), temperature source Oral, resp. rate 15, height 6\' 2"  (1.88 m), weight 97.5 kg, SpO2 100 %.  PHYSICAL EXAMINATION:  Physical Exam  GENERAL:  67 y.o.-year-old patient lying in the bed with no acute distress.  EYES: Pupils equal, round, reactive to light and accommodation. No scleral icterus. Extraocular muscles intact.  HEENT: Head atraumatic, normocephalic. Oropharynx and nasopharynx clear.  NECK:  Supple, no jugular venous distention. No thyroid enlargement, no tenderness.  LUNGS: Bilateral crackles.  No use of accessory muscle to breathe.  No use of accessory  muscles of respiration.  CARDIOVASCULAR: S1, S2 normal. No murmurs, rubs, or gallops.  ABDOMEN: Soft, diffuse tenderness and distention.  No bowel sounds present. No organomegaly or mass.  EXTREMITIES: No pedal edema, cyanosis, or clubbing.  NEUROLOGIC: Cranial nerves II through XII are intact. Muscle strength 4/5 in all extremities. Sensation intact. Gait not checked.  PSYCHIATRIC: The patient is alert and oriented x 3.  SKIN: No obvious rash, lesion, or ulcer.   LABORATORY PANEL:   CBC Recent Labs  Lab 08/13/18 0810  WBC 5.1  HGB 12.6*  HCT 36.3*  PLT 91*   ------------------------------------------------------------------------------------------------------------------  Chemistries  Recent Labs  Lab 08/13/18 0810  NA 137  K 4.3  CL 95*  CO2 24  GLUCOSE 176*  BUN 41*  CREATININE 1.40*  CALCIUM 9.4  AST 37  ALT 16  ALKPHOS 68  BILITOT 1.0   ------------------------------------------------------------------------------------------------------------------  Cardiac Enzymes Recent Labs  Lab 08/13/18 0810  TROPONINI <0.03   ------------------------------------------------------------------------------------------------------------------  RADIOLOGY:  Dg Chest 2 View  Result Date: 08/13/2018 CLINICAL DATA:  Right chest/rib pain.  Shortness of breath. EXAM: CHEST - 2 VIEW COMPARISON:  03/31/2017 FINDINGS: Very low lung volumes. Vascular congestion and perihilar opacities with interstitial prominence, left greater than right could reflect asymmetric edema or infection. No visible significant effusions. No acute bony abnormality. No visible rib fracture or pneumothorax. IMPRESSION: Very low lung volumes with vascular congestion and interstitial prominence throughout the lungs, left greater than right. This could reflect asymmetric edema or infection. No visible displaced rib fracture or pneumothorax. Electronically Signed   By: Rolm Baptise M.D.   On: 08/13/2018 08:53   Ct  Angio Chest Pe W And/or Wo Contrast  Result Date: 08/13/2018 CLINICAL DATA:  Non-small cell lung cancer, adrenal/peritoneal metastases. Shortness of breath. EXAM: CT ANGIOGRAPHY CHEST CT ABDOMEN AND PELVIS WITH CONTRAST TECHNIQUE: Multidetector CT imaging of the chest was performed using the standard protocol during bolus administration of intravenous contrast. Multiplanar CT image reconstructions and MIPs were obtained to evaluate the vascular anatomy. Multidetector CT imaging of the abdomen and pelvis was performed using the standard protocol during bolus administration of intravenous contrast. CONTRAST:  147mL ISOVUE-370 IOPAMIDOL (ISOVUE-370) INJECTION 76% COMPARISON:  None. FINDINGS: CTA CHEST FINDINGS Cardiovascular: Satisfactory opacification the bilateral pulmonary artery to the lobar level. No evidence of pulmonary embolism. Cardiomegaly.  No pericardial effusion. 4.3 cm thoracic aortic aneurysm at the level of the aortic root (series 6/image 144). Mediastinum/Nodes: No suspicious mediastinal lymphadenopathy. Bilateral perihilar regions are poorly evaluated. Visualized thyroid is unremarkable. Lungs/Pleura: Left upper lobe atelectasis/collapse with air bronchograms. Mild masslike opacity inferiorly in the lingula (series 5/image 40). Additional patchy/masslike opacity in the central left lower infrahilar region (series 5/image 37) and inferior left lower lobe (series 7/image 49). Moderate left pleural effusion. Right perihilar opacity, favoring radiation changes (series 7/image 32). Additional nodular/masslike opacity in the medial right lower lobe (series 5/image 35). Mild interlobular septal thickening in the lungs bilaterally. Mild subpleural nodularity in the lungs bilaterally, measuring up to 9 mm in the right middle lobe (series 7/image 44). Trace right pleural effusion.  No pneumothorax. Overall, in the absence of prior imaging, it is difficult to discern to what degree this reflects post treatment  changes versus tumor versus acute infection. However, radiation changes in the left upper lobe and bilateral perihilar regions are suspected. Superimposed tumor in the medial right lower lobe and left lower lobe are not excluded, although infection could have a similar appearance. Lymphangitic spread of tumor is suspected in the lungs bilaterally, with additional subpleural pulmonary metastases. Musculoskeletal: Mild degenerative changes the lower thoracic spine. Review of the MIP images confirms the above findings. CT ABDOMEN and PELVIS FINDINGS Hepatobiliary: Liver is within normal limits. No suspicious/enhancing hepatic lesions. Gallbladder is unremarkable. No intrahepatic or extrahepatic ductal dilatation. Pancreas: Within normal limits. Spleen: Within normal limits. Adrenals/Urinary Tract: 2.2 cm left adrenal metastasis. Mild thickening of the right adrenal gland without discrete mass. Kidneys are within normal limits.  No hydronephrosis. Bladder is within normal limits. Stomach/Bowel: Distended stomach. Multiple loops of dilated small bowel in the central abdomen, gradually tapering to decompressed loops of small bowel in the right lower quadrant. Possible transition point in the right mid abdomen (series 8/image 43). This appearance is considered worrisome for at least partial small bowel obstruction. Appendix is grossly unremarkable (series 8/image 32). Colon is relatively decompressed. Vascular/Lymphatic: No evidence of abdominal aortic aneurysm. Atherosclerotic calcifications of the abdominal aorta and branch vessels. No suspicious abdominopelvic lymphadenopathy. Reproductive: Prostate is unremarkable. Other: Small  volume abdominopelvic ascites. Associated peritoneal disease/omental caking, for example beneath the right lateral abdominal wall (series 8/image 40) and beneath the lower anterior abdominopelvic wall (series 8/image 64). Musculoskeletal: Visualized osseous structures are within normal limits.  Review of the MIP images confirms the above findings. IMPRESSION: No evidence of pulmonary embolism. Mildly dilated loops of small bowel in the central abdomen, with possible transition point in the right mid abdomen, worrisome for at least partial small bowel obstruction. Suspected radiation changes in the left upper lobe and bilateral perihilar regions. Superimposed patchy/masslike opacities in the bilateral lower lobes are worrisome for tumor, less likely infection. 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 Small left pleural effusion. Left adrenal metastasis. Associated peritoneal disease/omental caking and abdominopelvic ascites. Consider correlation with prior outside hospital studies for more reliable assessment of the patient's oncologic status. Electronically Signed   By: Julian Hy M.D.   On: 08/13/2018 12:54   Ct Abdomen Pelvis W Contrast  Result Date: 08/13/2018 CLINICAL DATA:  Non-small cell lung cancer, adrenal/peritoneal metastases. Shortness of breath. EXAM: CT ANGIOGRAPHY CHEST CT ABDOMEN AND PELVIS WITH CONTRAST TECHNIQUE: Multidetector CT imaging of the chest was performed using the standard protocol during bolus administration of intravenous contrast. Multiplanar CT image reconstructions and MIPs were obtained to evaluate the vascular anatomy. Multidetector CT imaging of the abdomen and pelvis was performed using the standard protocol during bolus administration of intravenous contrast. CONTRAST:  117mL ISOVUE-370 IOPAMIDOL (ISOVUE-370) INJECTION 76% COMPARISON:  None. FINDINGS: CTA CHEST FINDINGS Cardiovascular: Satisfactory opacification the bilateral pulmonary artery to the lobar level. No evidence of pulmonary embolism. Cardiomegaly.  No pericardial effusion. 4.3 cm thoracic aortic aneurysm at the level of the aortic root (series 6/image 144). Mediastinum/Nodes: No suspicious mediastinal lymphadenopathy.  Bilateral perihilar regions are poorly evaluated. Visualized thyroid is unremarkable. Lungs/Pleura: Left upper lobe atelectasis/collapse with air bronchograms. Mild masslike opacity inferiorly in the lingula (series 5/image 40). Additional patchy/masslike opacity in the central left lower infrahilar region (series 5/image 37) and inferior left lower lobe (series 7/image 49). Moderate left pleural effusion. Right perihilar opacity, favoring radiation changes (series 7/image 32). Additional nodular/masslike opacity in the medial right lower lobe (series 5/image 35). Mild interlobular septal thickening in the lungs bilaterally. Mild subpleural nodularity in the lungs bilaterally, measuring up to 9 mm in the right middle lobe (series 7/image 44). Trace right pleural effusion.  No pneumothorax. Overall, in the absence of prior imaging, it is difficult to discern to what degree this reflects post treatment changes versus tumor versus acute infection. However, radiation changes in the left upper lobe and bilateral perihilar regions are suspected. Superimposed tumor in the medial right lower lobe and left lower lobe are not excluded, although infection could have a similar appearance. Lymphangitic spread of tumor is suspected in the lungs bilaterally, with additional subpleural pulmonary metastases. Musculoskeletal: Mild degenerative changes the lower thoracic spine. Review of the MIP images confirms the above findings. CT ABDOMEN and PELVIS FINDINGS Hepatobiliary: Liver is within normal limits. No suspicious/enhancing hepatic lesions. Gallbladder is unremarkable. No intrahepatic or extrahepatic ductal dilatation. Pancreas: Within normal limits. Spleen: Within normal limits. Adrenals/Urinary Tract: 2.2 cm left adrenal metastasis. Mild thickening of the right adrenal gland without discrete mass. Kidneys are within normal limits.  No hydronephrosis. Bladder is within normal limits. Stomach/Bowel: Distended stomach. Multiple  loops of dilated small bowel in the central abdomen, gradually tapering to decompressed loops of small bowel in the right lower quadrant. Possible transition point in the  right mid abdomen (series 8/image 43). This appearance is considered worrisome for at least partial small bowel obstruction. Appendix is grossly unremarkable (series 8/image 32). Colon is relatively decompressed. Vascular/Lymphatic: No evidence of abdominal aortic aneurysm. Atherosclerotic calcifications of the abdominal aorta and branch vessels. No suspicious abdominopelvic lymphadenopathy. Reproductive: Prostate is unremarkable. Other: Small volume abdominopelvic ascites. Associated peritoneal disease/omental caking, for example beneath the right lateral abdominal wall (series 8/image 40) and beneath the lower anterior abdominopelvic wall (series 8/image 64). Musculoskeletal: Visualized osseous structures are within normal limits. Review of the MIP images confirms the above findings. IMPRESSION: No evidence of pulmonary embolism. Mildly dilated loops of small bowel in the central abdomen, with possible transition point in the right mid abdomen, worrisome for at least partial small bowel obstruction. Suspected radiation changes in the left upper lobe and bilateral perihilar regions. Superimposed patchy/masslike opacities in the bilateral lower lobes are worrisome for tumor, less likely infection. 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 Small left pleural effusion. Left adrenal metastasis. Associated peritoneal disease/omental caking and abdominopelvic ascites. Consider correlation with prior outside hospital studies for more reliable assessment of the patient's oncologic status. Electronically Signed   By: Julian Hy M.D.   On: 08/13/2018 12:54   Dg Abd 2 Views  Result Date: 08/13/2018 CLINICAL DATA:  Abdominal pain EXAM: ABDOMEN - 2 VIEW COMPARISON:  None.  FINDINGS: Supine and upright images obtained. There is air throughout much of bowel. There are several loops of borderline dilated small bowel without air-fluid levels. No free air. Postoperative changes noted in the scrotal regions. There are small phleboliths in the pelvis. Visualized lung bases are essentially clear. IMPRESSION: Slight dilatation of the small bowel loops without air-fluid levels. Suspect a degree of ileus or possible enteritis. Bowel obstruction felt to be less likely. Air is noted in portions of colon and rectum. No free air demonstrable. Electronically Signed   By: Lowella Grip III M.D.   On: 08/13/2018 09:13      IMPRESSION AND PLAN:   Partial small bowel obstruction. The patient will be admitted to medical floor. N.p.o. except medication, IV fluid support, NGT PRN, surgical consult.  Possible upper GI bleeding. N.p.o. except medication, IV fluid support, Protonix IV twice daily, GI consult.  Acute renal failure due to dehydration.  IV fluid support and follow-up BMP.  Lung cancer with metastasis.  Follow-up with [facility] oncologist as outpatient.  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 Continue oxygen by nasal cannula, nebulizer as needed.  Nonsustained V. tach.  Cardiac monitor and cardiology consult.  I discussed with Dr. Alice Reichert. All the records are reviewed and case discussed with ED provider. Management plans discussed with the patient, his daughter and they are in agreement.  CODE STATUS: Full code  TOTAL TIME TAKING CARE OF THIS PATIENT: 42 minutes.    Demetrios Loll M.D on 08/13/2018 at 2:46 PM  Between 7am to 6pm - Pager - [phone]  After 6pm go to [email or web address] - Proofreader  [facility]  Office  [phone]  CC: Primary care physician; Gayland Curry, MD   Note: This dictation was prepared with Dragon dictation along with smaller phrase  technology. Any transcriptional errors that result from this process are unin

## 2018-08-13 NOTE — ED Notes (Signed)
ED Provider at bedside. 

## 2018-08-13 NOTE — Consult Note (Signed)
GI Inpatient Consult Note  Reason for Consult: UGI bleed   Attending Requesting Consult: Dr. Demetrios Loll, MD  History of Present Illness: Nathaniel Dixon is a 67 y.o. male seen for evaluation of coffee ground emesis, abdominal pain at the request of Dr. Bridgett Larsson. 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 Pt wears 3L O2 via nasal canula at home s/t pneumonitis and chemotherapy. He has upcoming treatment at [facility] next Tuesday. Pt reports a 2-3 day history of diffuse abdominal pain, distention, and constipation. He reports a two-day history of coffee ground emesis. CT abd/pelvis in ED shows partial small bowel obstruction. Pt was recently accepted by [facility] oncologist Dr. Tasia Catchings but he is currently waiting bed placement.   Pt seen and examined today in hospital bed in mild respiratory distress. He is wearing 4L O2 via nasal canula. He reports persistent cough and some difficulty taking deep breaths. He reports diffuse abd pain for the past 3 days, worsening today which made him come to the ED. He reports nausea and vomiting for the past two days, with the emesis appearing like coffee grounds. Abd pain is 6/10 currently, sharp in nature, and made worse when taking a deep breath or pressing on it. Abd pain does not radiate. His last BM was Sunday morning and was loose. He denies bright red blood per rectum or dark, tarry stools. He has reduced appetite over the past two days. He reports he has had just medications and some liquid to eat for the past two days. No family hx of colon cancer.    Last Colonoscopy: >10 years ago Last Endoscopy: No known knowledge    Past Medical History:  Past Medical History:  Diagnosis Date  . Cancer [facility])    lung    Problem List: Patient Active Problem List   Diagnosis Date Noted  . SBO (small bowel obstruction) ([place]) 08/13/2018  . Sepsis due to pneumonia ([place]) 03/31/2017    Past Surgical  History: Past Surgical History:  Procedure Laterality Date  . FINGER SURGERY Left     Allergies: Allergies  Allergen Reactions  . Statins Rash    Pain in legs, weakness Pain in legs, weakness    Home Medications: Medications Prior to Admission  Medication Sig Dispense Refill Last Dose  . albuterol (PROVENTIL HFA;VENTOLIN HFA) 108 (90 Base) MCG/ACT inhaler Inhale 2 puffs into the lungs every 6 (six) hours as needed for wheezing or shortness of breath.   PRN at PRN  . docusate sodium (COLACE) 100 MG capsule Take 100 mg by mouth 2 (two) times daily.   08/12/2018 at Unknown time  . HYDROcodone-homatropine (HYCODAN) 5-1.5 MG/5ML syrup Take 5 mLs by mouth every 6 (six) hours as needed for cough.   PRN at PRN  . HYDROmorphone (DILAUDID) 2 MG tablet Take 2 mg by mouth every 3 (three) hours as needed for severe pain.   PRN at PRN  . lactulose (CHRONULAC) 10 GM/15ML solution Take 30 mLs by mouth 3 (three) times daily as needed.  3 PRN at PRN  . lidocaine (XYLOCAINE) 2 % solution Use as directed 20 mLs in the mouth or throat as needed for mouth pain.   PRN at PRN  . mirtazapine (REMERON) 15 MG tablet Take 15 mg by mouth at bedtime.   08/12/2018 at 2100  . Morphine Sulfate ER 15 MG T12A Take 15 mg by mouth every 12 (twelve) hours.  08/13/2018 at 0700  . omega-3 acid ethyl esters (LOVAZA) 1 g capsule Take 1 g by mouth daily.   08/12/2018 at 0900  . omeprazole (PRILOSEC) 20 MG capsule Take 20 mg by mouth 2 (two) times daily before a meal.   08/12/2018 at 2100  . oxyCODONE (OXY IR/ROXICODONE) 5 MG immediate release tablet Take 5-10 mg by mouth every 4 (four) hours as needed for severe pain.   PRN at PRN  . predniSONE (DELTASONE) 10 MG tablet Take 10 mg by mouth every morning.   08/13/2018 at 0700  . prochlorperazine (COMPAZINE) 10 MG tablet Take 10 mg by mouth every 6 (six) hours as needed for nausea or vomiting.   PRN at PRN  . senna (SENOKOT) 8.6 MG TABS tablet Take 1 tablet by mouth daily.   08/12/2018 at  0900  . dextromethorphan (DELSYM) 30 MG/5ML liquid Take 5 mLs (30 mg total) by mouth 2 (two) times daily. (Patient not taking: Reported on 08/13/2018) 89 mL 0 Not Taking at Unknown time  . guaiFENesin (MUCINEX) 600 MG 12 hr tablet Take 1 tablet (600 mg total) by mouth 2 (two) times daily. (Patient not taking: Reported on 08/13/2018) 12 tablet 0 Not Taking at Unknown time   Home medication reconciliation was completed with the patient.   Scheduled Inpatient Medications:   . bisacodyl  10 mg Rectal Daily  . ipratropium-albuterol  3 mL Nebulization Q6H    Continuous Inpatient Infusions:   . 0.9 % NaCl with KCl 20 mEq / L    . famotidine (PEPCID) IV      PRN Inpatient Medications:  acetaminophen **OR** acetaminophen, albuterol, bisacodyl, HYDROcodone-acetaminophen, morphine injection, ondansetron **OR** ondansetron (ZOFRAN) IV, senna-docusate, sodium phosphate  Family History: family history includes Colon cancer in his brother; Lung cancer in his father.  The patient's family history is negative for inflammatory bowel disorders, GI malignancy, or solid organ transplantation.  Social History:   reports that he has quit smoking. He has never used smokeless tobacco. He reports that he does not drink alcohol or use drugs. The patient denies ETOH, tobacco, or drug use.   Review of Systems: Constitutional: Weight is stable.  Eyes: No changes in vision. ENT: No oral lesions, sore throat.  GI: see HPI.  Heme/Lymph: No easy bruising.  CV: No chest pain.  GU: No hematuria.  Integumentary: No rashes.  Neuro: No headaches.  Psych: No depression/anxiety.  Endocrine: No heat/cold intolerance.  Allergic/Immunologic: No urticaria.  Resp: + cough, + SOB Musculoskeletal: No joint swelling.    Physical Examination: BP (!) 118/92 (BP Location: Right Arm)   Pulse 94   Temp (!) 97.4 F (36.3 C) (Oral)   Resp 20   Ht 6\' 2"  (1.88 m)   Wt 97.5 kg   SpO2 95%   BMI 27.60 kg/m  Gen: mild  respiratory distress, alert and oriented x 4 HEENT: PEERLA, EOMI, Neck: supple, no JVD or thyromegaly Chest: wheezes bilaterally  CV: RRR, no m/g/c/r Abd: distended abdomen, diffuse abdominal tenderness in all four quadrants. No bowel sounds present. Ext: no edema, well perfused with 2+ pulses, Skin: no rash or lesions noted Lymph: no LAD  Data: Lab Results  Component Value Date   WBC 5.1 08/13/2018   HGB 12.6 (L) 08/13/2018   HCT 36.3 (L) 08/13/2018   MCV 100.5 (H) 08/13/2018   PLT 91 (L) 08/13/2018   Recent Labs  Lab 08/13/18 0810  HGB 12.6*   Lab Results  Component Value Date   NA  137 08/13/2018   K 4.3 08/13/2018   CL 95 (L) 08/13/2018   CO2 24 08/13/2018   BUN 41 (H) 08/13/2018   CREATININE 1.40 (H) 08/13/2018   Lab Results  Component Value Date   ALT 16 08/13/2018   AST 37 08/13/2018   ALKPHOS 68 08/13/2018   BILITOT 1.0 08/13/2018   No results for input(s): APTT, INR, PTT in the last 168 hours. Assessment/Plan: Mr. Ishaq is a 67 y/o Caucasian male with a PMH of lung cancer with metastasis to peritoneal lining of abdomen and  Adrenal glands followed by [facility] admitted for several day hx of diffuse abdominal pain, nausea, and vomiting. GI was consulted due to coffee ground emesis.   1. Partial small bowel obstruction 2. Nausea and vomiting, coffee ground emesis 3. Lung cancer with metastasis  - CT scan abd/pelvis showing partial small bowel obstruction. Agree with surgical consult for further evaluation and management - Coffee ground emesis concerning for upper GI bleed. Pt is currently hemodynamically stable, hemoglobin 12.6. Partial SBO is a relative contraindication to performing EGD at this time. - Pt would benefit from surgical consult and treatment of SBO before considering upper endoscopy. If patient experiences intractable vomiting or coffee ground emesis becomes significantly worse, please call and we can further assess for melena - Continue  Protonix IV twice daily - We will continue to follow along    Thank you for the consult. Please call with questions or concerns.  Geanie Kenning, PA-C [facility]  [phone]

## 2018-08-13 NOTE — ED Notes (Signed)
Patient transported to X-ray 

## 2018-08-13 NOTE — ED Notes (Signed)
Discussed with MD regarding morphine orders due to pt BP 96/83. VORB for 1L NS bolus and change morphine orders to 2mg .

## 2018-08-13 NOTE — ED Triage Notes (Signed)
Pt arrived via POV from home with wife with reports of shortness of breath that started this morning. Pt is being treated for lung CA at [facility] with mets to adrenal glands and abd lining.  Pt is receiving chemo about every 3 weeks.  Pt recently at [facility] for pain control.  Pt wears 3L [place] chronically, but states he has been wearing  4L because of the increased SOB.

## 2018-08-13 NOTE — ED Provider Notes (Addendum)
[facility] Emergency Department Provider Note   ____________________________________________   First MD Initiated Contact with Patient 08/13/18 0800     (approximate)  I have reviewed the triage vital signs and the nursing notes.   HISTORY  Chief Complaint Shortness of Breath    HPI Nathaniel Dixon is a 67 y.o. male patient with lung cancer now with mets to the peritoneal lining of the abdomen and the adrenal glands.  Supposed to get chemo in the next couple days at [facility] he is been receiving chemo there already.  He also has a history of some inflammation of the appendix which is being treated with antibiotics and difficulty stooling because of pain.  Patient felt a pop in the right posterior chest this morning and is now very short of breath it hurts to breathe it hurts to palpate the area.  Pain is 10 out of 10.  Patient is requiring 4 L oxygen instead of his usual 3.   Past Medical History:  Diagnosis Date  . Cancer [facility])    lung    Patient Active Problem List   Diagnosis Date Noted  . Sepsis due to pneumonia ([place]) 03/31/2017    Past Surgical History:  Procedure Laterality Date  . FINGER SURGERY Left     Prior to Admission medications   Medication Sig Start Date End Date Taking? Authorizing Provider  albuterol (PROVENTIL HFA;VENTOLIN HFA) 108 (90 Base) MCG/ACT inhaler Inhale 2 puffs into the lungs every 6 (six) hours as needed for wheezing or shortness of breath.   Yes [provider]  docusate sodium (COLACE) 100 MG capsule Take 100 mg by mouth 2 (two) times daily.   Yes [provider]  HYDROcodone-homatropine (HYCODAN) 5-1.5 MG/5ML syrup Take 5 mLs by mouth every 6 (six) hours as needed for cough.   Yes [provider]  HYDROmorphone (DILAUDID) 2 MG tablet Take 2 mg by mouth every 3 (three) hours as needed for severe pain.   Yes [provider]  lactulose (CHRONULAC) 10 GM/15ML solution Take 30 mLs by  mouth 3 (three) times daily as needed. 07/31/18  Yes [provider]  lidocaine (XYLOCAINE) 2 % solution Use as directed 20 mLs in the mouth or throat as needed for mouth pain.   Yes [provider]  mirtazapine (REMERON) 15 MG tablet Take 15 mg by mouth at bedtime.   Yes [provider]  Morphine Sulfate ER 15 MG T12A Take 15 mg by mouth every 12 (twelve) hours.    Yes [provider]  omega-3 acid ethyl esters (LOVAZA) 1 g capsule Take 1 g by mouth daily.   Yes [provider]  omeprazole (PRILOSEC) 20 MG capsule Take 20 mg by mouth 2 (two) times daily before a meal.   Yes [provider]  oxyCODONE (OXY IR/ROXICODONE) 5 MG immediate release tablet Take 5-10 mg by mouth every 4 (four) hours as needed for severe pain.   Yes [provider]  predniSONE (DELTASONE) 10 MG tablet Take 10 mg by mouth every morning.   Yes [provider]  prochlorperazine (COMPAZINE) 10 MG tablet Take 10 mg by mouth every 6 (six) hours as needed for nausea or vomiting.   Yes [provider]  senna (SENOKOT) 8.6 MG TABS tablet Take 1 tablet by mouth daily.   Yes [provider]  dextromethorphan (DELSYM) 30 MG/5ML liquid Take 5 mLs (30 mg total) by mouth 2 (two) times daily. Patient not taking: Reported  on 08/13/2018 04/02/17   Fritzi Mandes, MD  guaiFENesin (MUCINEX) 600 MG 12 hr tablet Take 1 tablet (600 mg total) by mouth 2 (two) times daily. Patient not taking: Reported on 08/13/2018 04/02/17   Fritzi Mandes, MD    Allergies Statins  History reviewed. No pertinent family history.  Social History Social History   Tobacco Use  . Smoking status: Former Research scientist (life sciences)  . Smokeless tobacco: Never Used  Substance Use Topics  . Alcohol use: No    Comment: occ  . Drug use: No    Review of Systems  Constitutional: No fever/chills but feels warm Eyes: No visual changes. ENT: No sore throat. Cardiovascular:  chest pain right posterior and  lateral chest as noted. Respiratory:  shortness of breath. Gastrointestinal: abdominal pain.  No nausea, no vomiting.  No diarrhea.   constipation. Genitourinary: Negative for dysuria. Musculoskeletal: Negative for back pain. Skin: Negative for rash. Neurological: Negative for headaches, focal weakness   ____________________________________________   PHYSICAL EXAM:  VITAL SIGNS: ED Triage Vitals  Enc Vitals Group     BP 08/13/18 0805 96/83     Pulse Rate 08/13/18 0805 (!) 106     Resp --      Temp 08/13/18 0805 (!) 97.4 F (36.3 C)     Temp Source 08/13/18 0805 Oral     SpO2 08/13/18 0805 91 %     Weight 08/13/18 0803 215 lb (97.5 kg)     Height 08/13/18 0803 6\' 2"  (1.88 m)     Head Circumference --      Peak Flow --      Pain Score 08/13/18 0804 10     Pain Loc --      Pain Edu? --      Excl. in [place]? --     Constitutional: Alert and oriented. Ill appearing and in  acute distress. Eyes: Conjunctivae are normal.   Head: Atraumatic. Nose: No congestion/rhinnorhea. Mouth/Throat: Mucous membranes are moist.  Oropharynx non-erythematous. Neck: No stridor.   Cardiovascular: Normal rate, regular rhythm. Grossly normal heart sounds.  Good peripheral circulation. Respiratory: Increased respiratory effort.  No retractions. Lungs scattered wheezes and crackles chest wall is tender in the right posterior ribs Gastrointestinal: Soft and mild to moderately diffusely tender. No distention. No abdominal bruits. No CVA tenderness. Musculoskeletal: No lower extremity tenderness nor edema.  No joint effusions. Neurologic:  Normal speech and language. No gross focal neurologic deficits are appreciated.  Skin:  Skin is warm, dry and intact. No rash noted. Psychiatric: Mood and affect are normal. Speech and behavior are normal.  ____________________________________________   LABS (all labs ordered are listed, but only abnormal results are displayed)  Labs Reviewed  COMPREHENSIVE  METABOLIC PANEL - Abnormal; Notable for the following components:      Result Value   Chloride 95 (*)    Glucose, Bld 176 (*)    BUN 41 (*)    Creatinine, Ser 1.40 (*)    Albumin 3.0 (*)    GFR calc non Af Amer 50 (*)    GFR calc Af Amer 58 (*)    Anion gap 18 (*)    All other components within normal limits  CBC WITH DIFFERENTIAL/PLATELET - Abnormal; Notable for the following components:   RBC 3.61 (*)    Hemoglobin 12.6 (*)    HCT 36.3 (*)    MCV 100.5 (*)    MCH 35.0 (*)    RDW 14.6 (*)    Platelets 91 (*)  All other components within normal limits  BRAIN NATRIURETIC PEPTIDE - Abnormal; Notable for the following components:   B Natriuretic Peptide 123.0 (*)    All other components within normal limits  TROPONIN I  URINALYSIS, COMPLETE (UACMP) WITH MICROSCOPIC   ____________________________________________  EKG EKG read and interpreted by me shows sinus tachycardia rate of 101 rightward axis flipped T waves inferiorly and laterally in the V leads but otherwise no acute changes.  T waves are much worse since April of last year  ____________________________________________  RADIOLOGY  ED MD interpretation:    Official radiology report(s): Dg Chest 2 View  Result Date: 08/13/2018 CLINICAL DATA:  Right chest/rib pain.  Shortness of breath. EXAM: CHEST - 2 VIEW COMPARISON:  03/31/2017 FINDINGS: Very low lung volumes. Vascular congestion and perihilar opacities with interstitial prominence, left greater than right could reflect asymmetric edema or infection. No visible significant effusions. No acute bony abnormality. No visible rib fracture or pneumothorax. IMPRESSION: Very low lung volumes with vascular congestion and interstitial prominence throughout the lungs, left greater than right. This could reflect asymmetric edema or infection. No visible displaced rib fracture or pneumothorax. Electronically Signed   By: Rolm Baptise M.D.   On: 08/13/2018 08:53   Ct Angio Chest Pe  W And/or Wo Contrast  Result Date: 08/13/2018 CLINICAL DATA:  Non-small cell lung cancer, adrenal/peritoneal metastases. Shortness of breath. EXAM: CT ANGIOGRAPHY CHEST CT ABDOMEN AND PELVIS WITH CONTRAST TECHNIQUE: Multidetector CT imaging of the chest was performed using the standard protocol during bolus administration of intravenous contrast. Multiplanar CT image reconstructions and MIPs were obtained to evaluate the vascular anatomy. Multidetector CT imaging of the abdomen and pelvis was performed using the standard protocol during bolus administration of intravenous contrast. CONTRAST:  183mL ISOVUE-370 IOPAMIDOL (ISOVUE-370) INJECTION 76% COMPARISON:  None. FINDINGS: CTA CHEST FINDINGS Cardiovascular: Satisfactory opacification the bilateral pulmonary artery to the lobar level. No evidence of pulmonary embolism. Cardiomegaly.  No pericardial effusion. 4.3 cm thoracic aortic aneurysm at the level of the aortic root (series 6/image 144). Mediastinum/Nodes: No suspicious mediastinal lymphadenopathy. Bilateral perihilar regions are poorly evaluated. Visualized thyroid is unremarkable. Lungs/Pleura: Left upper lobe atelectasis/collapse with air bronchograms. Mild masslike opacity inferiorly in the lingula (series 5/image 40). Additional patchy/masslike opacity in the central left lower infrahilar region (series 5/image 37) and inferior left lower lobe (series 7/image 49). Moderate left pleural effusion. Right perihilar opacity, favoring radiation changes (series 7/image 32). Additional nodular/masslike opacity in the medial right lower lobe (series 5/image 35). Mild interlobular septal thickening in the lungs bilaterally. Mild subpleural nodularity in the lungs bilaterally, measuring up to 9 mm in the right middle lobe (series 7/image 44). Trace right pleural effusion.  No pneumothorax. Overall, in the absence of prior imaging, it is difficult to discern to what degree this reflects post treatment changes versus  tumor versus acute infection. However, radiation changes in the left upper lobe and bilateral perihilar regions are suspected. Superimposed tumor in the medial right lower lobe and left lower lobe are not excluded, although infection could have a similar appearance. Lymphangitic spread of tumor is suspected in the lungs bilaterally, with additional subpleural pulmonary metastases. Musculoskeletal: Mild degenerative changes the lower thoracic spine. Review of the MIP images confirms the above findings. CT ABDOMEN and PELVIS FINDINGS Hepatobiliary: Liver is within normal limits. No suspicious/enhancing hepatic lesions. Gallbladder is unremarkable. No intrahepatic or extrahepatic ductal dilatation. Pancreas: Within normal limits. Spleen: Within normal limits. Adrenals/Urinary Tract: 2.2 cm left adrenal metastasis. Mild thickening of  the right adrenal gland without discrete mass. Kidneys are within normal limits.  No hydronephrosis. Bladder is within normal limits. Stomach/Bowel: Distended stomach. Multiple loops of dilated small bowel in the central abdomen, gradually tapering to decompressed loops of small bowel in the right lower quadrant. Possible transition point in the right mid abdomen (series 8/image 43). This appearance is considered worrisome for at least partial small bowel obstruction. Appendix is grossly unremarkable (series 8/image 32). Colon is relatively decompressed. Vascular/Lymphatic: No evidence of abdominal aortic aneurysm. Atherosclerotic calcifications of the abdominal aorta and branch vessels. No suspicious abdominopelvic lymphadenopathy. Reproductive: Prostate is unremarkable. Other: Small volume abdominopelvic ascites. Associated peritoneal disease/omental caking, for example beneath the right lateral abdominal wall (series 8/image 40) and beneath the lower anterior abdominopelvic wall (series 8/image 64). Musculoskeletal: Visualized osseous structures are within normal limits. Review of the  MIP images confirms the above findings. IMPRESSION: No evidence of pulmonary embolism. Mildly dilated loops of small bowel in the central abdomen, with possible transition point in the right mid abdomen, worrisome for at least partial small bowel obstruction. Suspected radiation changes in the left upper lobe and bilateral perihilar regions. Superimposed patchy/masslike opacities in the bilateral lower lobes are worrisome for tumor, less likely infection. 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 Small left pleural effusion. Left adrenal metastasis. Associated peritoneal disease/omental caking and abdominopelvic ascites. Consider correlation with prior outside hospital studies for more reliable assessment of the patient's oncologic status. Electronically Signed   By: Julian Hy M.D.   On: 08/13/2018 12:54   Ct Abdomen Pelvis W Contrast  Result Date: 08/13/2018 CLINICAL DATA:  Non-small cell lung cancer, adrenal/peritoneal metastases. Shortness of breath. EXAM: CT ANGIOGRAPHY CHEST CT ABDOMEN AND PELVIS WITH CONTRAST TECHNIQUE: Multidetector CT imaging of the chest was performed using the standard protocol during bolus administration of intravenous contrast. Multiplanar CT image reconstructions and MIPs were obtained to evaluate the vascular anatomy. Multidetector CT imaging of the abdomen and pelvis was performed using the standard protocol during bolus administration of intravenous contrast. CONTRAST:  150mL ISOVUE-370 IOPAMIDOL (ISOVUE-370) INJECTION 76% COMPARISON:  None. FINDINGS: CTA CHEST FINDINGS Cardiovascular: Satisfactory opacification the bilateral pulmonary artery to the lobar level. No evidence of pulmonary embolism. Cardiomegaly.  No pericardial effusion. 4.3 cm thoracic aortic aneurysm at the level of the aortic root (series 6/image 144). Mediastinum/Nodes: No suspicious mediastinal lymphadenopathy. Bilateral perihilar  regions are poorly evaluated. Visualized thyroid is unremarkable. Lungs/Pleura: Left upper lobe atelectasis/collapse with air bronchograms. Mild masslike opacity inferiorly in the lingula (series 5/image 40). Additional patchy/masslike opacity in the central left lower infrahilar region (series 5/image 37) and inferior left lower lobe (series 7/image 49). Moderate left pleural effusion. Right perihilar opacity, favoring radiation changes (series 7/image 32). Additional nodular/masslike opacity in the medial right lower lobe (series 5/image 35). Mild interlobular septal thickening in the lungs bilaterally. Mild subpleural nodularity in the lungs bilaterally, measuring up to 9 mm in the right middle lobe (series 7/image 44). Trace right pleural effusion.  No pneumothorax. Overall, in the absence of prior imaging, it is difficult to discern to what degree this reflects post treatment changes versus tumor versus acute infection. However, radiation changes in the left upper lobe and bilateral perihilar regions are suspected. Superimposed tumor in the medial right lower lobe and left lower lobe are not excluded, although infection could have a similar appearance. Lymphangitic spread of tumor is suspected in the lungs bilaterally, with additional subpleural pulmonary metastases. Musculoskeletal: Mild degenerative changes the  lower thoracic spine. Review of the MIP images confirms the above findings. CT ABDOMEN and PELVIS FINDINGS Hepatobiliary: Liver is within normal limits. No suspicious/enhancing hepatic lesions. Gallbladder is unremarkable. No intrahepatic or extrahepatic ductal dilatation. Pancreas: Within normal limits. Spleen: Within normal limits. Adrenals/Urinary Tract: 2.2 cm left adrenal metastasis. Mild thickening of the right adrenal gland without discrete mass. Kidneys are within normal limits.  No hydronephrosis. Bladder is within normal limits. Stomach/Bowel: Distended stomach. Multiple loops of dilated  small bowel in the central abdomen, gradually tapering to decompressed loops of small bowel in the right lower quadrant. Possible transition point in the right mid abdomen (series 8/image 43). This appearance is considered worrisome for at least partial small bowel obstruction. Appendix is grossly unremarkable (series 8/image 32). Colon is relatively decompressed. Vascular/Lymphatic: No evidence of abdominal aortic aneurysm. Atherosclerotic calcifications of the abdominal aorta and branch vessels. No suspicious abdominopelvic lymphadenopathy. Reproductive: Prostate is unremarkable. Other: Small volume abdominopelvic ascites. Associated peritoneal disease/omental caking, for example beneath the right lateral abdominal wall (series 8/image 40) and beneath the lower anterior abdominopelvic wall (series 8/image 64). Musculoskeletal: Visualized osseous structures are within normal limits. Review of the MIP images confirms the above findings. IMPRESSION: No evidence of pulmonary embolism. Mildly dilated loops of small bowel in the central abdomen, with possible transition point in the right mid abdomen, worrisome for at least partial small bowel obstruction. Suspected radiation changes in the left upper lobe and bilateral perihilar regions. Superimposed patchy/masslike opacities in the bilateral lower lobes are worrisome for tumor, less likely infection. 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 Small left pleural effusion. Left adrenal metastasis. Associated peritoneal disease/omental caking and abdominopelvic ascites. Consider correlation with prior outside hospital studies for more reliable assessment of the patient's oncologic status. Electronically Signed   By: Julian Hy M.D.   On: 08/13/2018 12:54   Dg Abd 2 Views  Result Date: 08/13/2018 CLINICAL DATA:  Abdominal pain EXAM: ABDOMEN - 2 VIEW COMPARISON:  None. FINDINGS: Supine and  upright images obtained. There is air throughout much of bowel. There are several loops of borderline dilated small bowel without air-fluid levels. No free air. Postoperative changes noted in the scrotal regions. There are small phleboliths in the pelvis. Visualized lung bases are essentially clear. IMPRESSION: Slight dilatation of the small bowel loops without air-fluid levels. Suspect a degree of ileus or possible enteritis. Bowel obstruction felt to be less likely. Air is noted in portions of colon and rectum. No free air demonstrable. Electronically Signed   By: Lowella Grip III M.D.   On: 08/13/2018 09:13    ____________________________________________   PROCEDURES  Procedure(s) performed:   Procedures  Critical Care performed: Critical care time 1 hour this includes discussing the patient with [facility] hospitalist reviewing the patient's records and CT scan.  [facility] has no beds he is on a waiting list there.  ____________________________________________   INITIAL IMPRESSION / ASSESSMENT AND PLAN / ED COURSE   Patient is a rectal exam is negative there is nothing there at all as I suspected after I reviewed the CT scan.  Patient has vomited not very much but what has come up is Hemoccult positive and looks like coffee grounds.        ____________________________________________   FINAL CLINICAL IMPRESSION(S) / ED DIAGNOSES  Final diagnoses:  Abdominal pain  Partial small bowel obstruction (HCC)  Hematemesis with nausea     ED Discharge Orders    None  Note:  This document was prepared using Dragon voice recognition software and may include unintentional dictation errors.    Nena Polio, MD 08/13/18 1430    Nena Polio, MD 08/26/18 2013

## 2018-08-13 NOTE — ED Notes (Signed)
Pt had several runs of PVC's, printed and given to Dr. Cinda Quest.

## 2018-08-13 NOTE — ED Notes (Signed)
Patient transported to CT 

## 2018-08-13 NOTE — ED Notes (Signed)
Pt resting in bed drinking oral contrast at this time. Pt's family remains at bedside. Family member to desk asked that his medical records be released to [facility]. This RN and Opal Sidles, RN explained should be able to see through Epic. Will continue to monitor for further patient needs.

## 2018-08-13 NOTE — Consult Note (Signed)
[facility] Consultation Note  Patient ID: Nathaniel Dixon, MRN: [id number], DOB/AGE: 69/08/1951 69 y.o. Admit date: 08/13/2018   Date of Consult: 08/13/2018 Primary Physician: Gayland Curry, MD Primary Cardiologist: None  Chief Complaint:  Chief Complaint  Patient presents with  . Shortness of Breath   Reason for Consult: The focal PVCs and nonsustained ventricular tachycardia  HPI: 67 y.o. male with known lung cancer with metastatic issues with chronic kidney disease and acute on chronic respiratory failure.  The patient came in with a worsening shortness of breath cough congestion and hypoxia.  At that time the patient was placed on appropriate medication management and antibiotics and has had some improvements.  During this early stay the patient has had a BNP of 123 and a normal troponin but also had telemetry showing multifocal PVCs and nonsustained ventricular tachycardia of 3 beats in a row.  This is most consistent with electrolyte abnormalities hypoxia and current illness rather than acute coronary syndrome or congestive heart failure.  Patient is relatively weak and still somewhat hypoxic  Past Medical History:  Diagnosis Date  . Cancer [facility])    lung      Surgical History:  Past Surgical History:  Procedure Laterality Date  . FINGER SURGERY Left      Home Meds: Prior to Admission medications   Medication Sig Start Date End Date Taking? Authorizing Provider  albuterol (PROVENTIL HFA;VENTOLIN HFA) 108 (90 Base) MCG/ACT inhaler Inhale 2 puffs into the lungs every 6 (six) hours as needed for wheezing or shortness of breath.   Yes [provider]  docusate sodium (COLACE) 100 MG capsule Take 100 mg by mouth 2 (two) times daily.   Yes [provider]  HYDROcodone-homatropine (HYCODAN) 5-1.5 MG/5ML syrup Take 5 mLs by mouth every 6 (six) hours as needed for cough.   Yes [provider]  HYDROmorphone (DILAUDID) 2 MG tablet Take 2 mg  by mouth every 3 (three) hours as needed for severe pain.   Yes [provider]  lactulose (CHRONULAC) 10 GM/15ML solution Take 30 mLs by mouth 3 (three) times daily as needed. 07/31/18  Yes [provider]  lidocaine (XYLOCAINE) 2 % solution Use as directed 20 mLs in the mouth or throat as needed for mouth pain.   Yes [provider]  mirtazapine (REMERON) 15 MG tablet Take 15 mg by mouth at bedtime.   Yes [provider]  Morphine Sulfate ER 15 MG T12A Take 15 mg by mouth every 12 (twelve) hours.    Yes [provider]  omega-3 acid ethyl esters (LOVAZA) 1 g capsule Take 1 g by mouth daily.   Yes [provider]  omeprazole (PRILOSEC) 20 MG capsule Take 20 mg by mouth 2 (two) times daily before a meal.   Yes [provider]  oxyCODONE (OXY IR/ROXICODONE) 5 MG immediate release tablet Take 5-10 mg by mouth every 4 (four) hours as needed for severe pain.   Yes [provider]  predniSONE (DELTASONE) 10 MG tablet Take 10 mg by mouth every morning.   Yes [provider]  prochlorperazine (COMPAZINE) 10 MG tablet Take 10 mg by mouth every 6 (six) hours as needed for nausea or vomiting.   Yes [provider]  senna (SENOKOT) 8.6 MG TABS tablet Take 1 tablet by mouth daily.   Yes [provider]  dextromethorphan (DELSYM) 30 MG/5ML liquid Take 5 mLs (30 mg total) by mouth 2 (two) times daily. Patient not taking: Reported  on 08/13/2018 04/02/17   Fritzi Mandes, MD  guaiFENesin (MUCINEX) 600 MG 12 hr tablet Take 1 tablet (600 mg total) by mouth 2 (two) times daily. Patient not taking: Reported on 08/13/2018 04/02/17   Fritzi Mandes, MD    Inpatient Medications:  . bisacodyl  10 mg Rectal Daily  . ipratropium-albuterol  3 mL Nebulization Q6H   . 0.9 % NaCl with KCl 20 mEq / L 100 mL/hr at 08/13/18 1802  . famotidine (PEPCID) IV      Allergies:  Allergies  Allergen Reactions  . Statins Rash    Pain in legs,  weakness Pain in legs, weakness    Social History   Socioeconomic History  . Marital status: Married    Spouse name: Not on file  . Number of children: Not on file  . Years of education: Not on file  . Highest education level: Not on file  Occupational History  . Not on file  Social Needs  . Financial resource strain: Not on file  . Food insecurity:    Worry: Not on file    Inability: Not on file  . Transportation needs:    Medical: Not on file    Non-medical: Not on file  Tobacco Use  . Smoking status: Former Research scientist (life sciences)  . Smokeless tobacco: Never Used  Substance and Sexual Activity  . Alcohol use: No    Comment: occ  . Drug use: No  . Sexual activity: Not on file  Lifestyle  . Physical activity:    Days per week: Not on file    Minutes per session: Not on file  . Stress: Not on file  Relationships  . Social connections:    Talks on phone: Not on file    Gets together: Not on file    Attends religious service: Not on file    Active member of club or organization: Not on file    Attends meetings of clubs or organizations: Not on file    Relationship status: Not on file  . Intimate partner violence:    Fear of current or ex partner: Not on file    Emotionally abused: Not on file    Physically abused: Not on file    Forced sexual activity: Not on file  Other Topics Concern  . Not on file  Social History Narrative  . Not on file     Family History  Problem Relation Age of Onset  . Lung cancer Father   . Colon cancer Brother      Review of Systems Positive for redness of breath cough congestion Negative for: General:  chills, fever, night sweats or weight changes.  Cardiovascular: PND orthopnea syncope dizziness  Dermatological skin lesions rashes Respiratory: Positive for cough congestion Urologic: Frequent urination urination at night and hematuria Abdominal: negative for nausea, vomiting, diarrhea, bright red blood per rectum, melena, or  hematemesis Neurologic: negative for visual changes, and/or hearing changes  All other systems reviewed and are otherwise negative except as noted above.  Labs: Recent Labs    08/13/18 0810  TROPONINI <0.03   Lab Results  Component Value Date   WBC 5.1 08/13/2018   HGB 12.6 (L) 08/13/2018   HCT 36.3 (L) 08/13/2018   MCV 100.5 (H) 08/13/2018   PLT 91 (L) 08/13/2018    Recent Labs  Lab 08/13/18 0810  NA 137  K 4.3  CL 95*  CO2 24  BUN 41*  CREATININE 1.40*  CALCIUM 9.4  PROT 7.5  BILITOT  1.0  ALKPHOS 68  ALT 16  AST 37  GLUCOSE 176*   No results found for: CHOL, HDL, LDLCALC, TRIG No results found for: DDIMER  Radiology/Studies:  Dg Chest 2 View  Result Date: 08/13/2018 CLINICAL DATA:  Right chest/rib pain.  Shortness of breath. EXAM: CHEST - 2 VIEW COMPARISON:  03/31/2017 FINDINGS: Very low lung volumes. Vascular congestion and perihilar opacities with interstitial prominence, left greater than right could reflect asymmetric edema or infection. No visible significant effusions. No acute bony abnormality. No visible rib fracture or pneumothorax. IMPRESSION: Very low lung volumes with vascular congestion and interstitial prominence throughout the lungs, left greater than right. This could reflect asymmetric edema or infection. No visible displaced rib fracture or pneumothorax. Electronically Signed   By: Rolm Baptise M.D.   On: 08/13/2018 08:53   Ct Angio Chest Pe W And/or Wo Contrast  Result Date: 08/13/2018 CLINICAL DATA:  Non-small cell lung cancer, adrenal/peritoneal metastases. Shortness of breath. EXAM: CT ANGIOGRAPHY CHEST CT ABDOMEN AND PELVIS WITH CONTRAST TECHNIQUE: Multidetector CT imaging of the chest was performed using the standard protocol during bolus administration of intravenous contrast. Multiplanar CT image reconstructions and MIPs were obtained to evaluate the vascular anatomy. Multidetector CT imaging of the abdomen and pelvis was performed using the  standard protocol during bolus administration of intravenous contrast. CONTRAST:  175mL ISOVUE-370 IOPAMIDOL (ISOVUE-370) INJECTION 76% COMPARISON:  None. FINDINGS: CTA CHEST FINDINGS Cardiovascular: Satisfactory opacification the bilateral pulmonary artery to the lobar level. No evidence of pulmonary embolism. Cardiomegaly.  No pericardial effusion. 4.3 cm thoracic aortic aneurysm at the level of the aortic root (series 6/image 144). Mediastinum/Nodes: No suspicious mediastinal lymphadenopathy. Bilateral perihilar regions are poorly evaluated. Visualized thyroid is unremarkable. Lungs/Pleura: Left upper lobe atelectasis/collapse with air bronchograms. Mild masslike opacity inferiorly in the lingula (series 5/image 40). Additional patchy/masslike opacity in the central left lower infrahilar region (series 5/image 37) and inferior left lower lobe (series 7/image 49). Moderate left pleural effusion. Right perihilar opacity, favoring radiation changes (series 7/image 32). Additional nodular/masslike opacity in the medial right lower lobe (series 5/image 35). Mild interlobular septal thickening in the lungs bilaterally. Mild subpleural nodularity in the lungs bilaterally, measuring up to 9 mm in the right middle lobe (series 7/image 44). Trace right pleural effusion.  No pneumothorax. Overall, in the absence of prior imaging, it is difficult to discern to what degree this reflects post treatment changes versus tumor versus acute infection. However, radiation changes in the left upper lobe and bilateral perihilar regions are suspected. Superimposed tumor in the medial right lower lobe and left lower lobe are not excluded, although infection could have a similar appearance. Lymphangitic spread of tumor is suspected in the lungs bilaterally, with additional subpleural pulmonary metastases. Musculoskeletal: Mild degenerative changes the lower thoracic spine. Review of the MIP images confirms the above findings. CT ABDOMEN  and PELVIS FINDINGS Hepatobiliary: Liver is within normal limits. No suspicious/enhancing hepatic lesions. Gallbladder is unremarkable. No intrahepatic or extrahepatic ductal dilatation. Pancreas: Within normal limits. Spleen: Within normal limits. Adrenals/Urinary Tract: 2.2 cm left adrenal metastasis. Mild thickening of the right adrenal gland without discrete mass. Kidneys are within normal limits.  No hydronephrosis. Bladder is within normal limits. Stomach/Bowel: Distended stomach. Multiple loops of dilated small bowel in the central abdomen, gradually tapering to decompressed loops of small bowel in the right lower quadrant. Possible transition point in the right mid abdomen (series 8/image 43). This appearance is considered worrisome for at least partial small bowel obstruction. Appendix  is grossly unremarkable (series 8/image 32). Colon is relatively decompressed. Vascular/Lymphatic: No evidence of abdominal aortic aneurysm. Atherosclerotic calcifications of the abdominal aorta and branch vessels. No suspicious abdominopelvic lymphadenopathy. Reproductive: Prostate is unremarkable. Other: Small volume abdominopelvic ascites. Associated peritoneal disease/omental caking, for example beneath the right lateral abdominal wall (series 8/image 40) and beneath the lower anterior abdominopelvic wall (series 8/image 64). Musculoskeletal: Visualized osseous structures are within normal limits. Review of the MIP images confirms the above findings. IMPRESSION: No evidence of pulmonary embolism. Mildly dilated loops of small bowel in the central abdomen, with possible transition point in the right mid abdomen, worrisome for at least partial small bowel obstruction. Suspected radiation changes in the left upper lobe and bilateral perihilar regions. Superimposed patchy/masslike opacities in the bilateral lower lobes are worrisome for tumor, less likely infection. 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 Small left pleural effusion. Left adrenal metastasis. Associated peritoneal disease/omental caking and abdominopelvic ascites. Consider correlation with prior outside hospital studies for more reliable assessment of the patient's oncologic status. Electronically Signed   By: Julian Hy M.D.   On: 08/13/2018 12:54   Ct Abdomen Pelvis W Contrast  Result Date: 08/13/2018 CLINICAL DATA:  Non-small cell lung cancer, adrenal/peritoneal metastases. Shortness of breath. EXAM: CT ANGIOGRAPHY CHEST CT ABDOMEN AND PELVIS WITH CONTRAST TECHNIQUE: Multidetector CT imaging of the chest was performed using the standard protocol during bolus administration of intravenous contrast. Multiplanar CT image reconstructions and MIPs were obtained to evaluate the vascular anatomy. Multidetector CT imaging of the abdomen and pelvis was performed using the standard protocol during bolus administration of intravenous contrast. CONTRAST:  189mL ISOVUE-370 IOPAMIDOL (ISOVUE-370) INJECTION 76% COMPARISON:  None. FINDINGS: CTA CHEST FINDINGS Cardiovascular: Satisfactory opacification the bilateral pulmonary artery to the lobar level. No evidence of pulmonary embolism. Cardiomegaly.  No pericardial effusion. 4.3 cm thoracic aortic aneurysm at the level of the aortic root (series 6/image 144). Mediastinum/Nodes: No suspicious mediastinal lymphadenopathy. Bilateral perihilar regions are poorly evaluated. Visualized thyroid is unremarkable. Lungs/Pleura: Left upper lobe atelectasis/collapse with air bronchograms. Mild masslike opacity inferiorly in the lingula (series 5/image 40). Additional patchy/masslike opacity in the central left lower infrahilar region (series 5/image 37) and inferior left lower lobe (series 7/image 49). Moderate left pleural effusion. Right perihilar opacity, favoring radiation changes (series 7/image 32). Additional nodular/masslike opacity in the medial  right lower lobe (series 5/image 35). Mild interlobular septal thickening in the lungs bilaterally. Mild subpleural nodularity in the lungs bilaterally, measuring up to 9 mm in the right middle lobe (series 7/image 44). Trace right pleural effusion.  No pneumothorax. Overall, in the absence of prior imaging, it is difficult to discern to what degree this reflects post treatment changes versus tumor versus acute infection. However, radiation changes in the left upper lobe and bilateral perihilar regions are suspected. Superimposed tumor in the medial right lower lobe and left lower lobe are not excluded, although infection could have a similar appearance. Lymphangitic spread of tumor is suspected in the lungs bilaterally, with additional subpleural pulmonary metastases. Musculoskeletal: Mild degenerative changes the lower thoracic spine. Review of the MIP images confirms the above findings. CT ABDOMEN and PELVIS FINDINGS Hepatobiliary: Liver is within normal limits. No suspicious/enhancing hepatic lesions. Gallbladder is unremarkable. No intrahepatic or extrahepatic ductal dilatation. Pancreas: Within normal limits. Spleen: Within normal limits. Adrenals/Urinary Tract: 2.2 cm left adrenal metastasis. Mild thickening of the right adrenal gland without discrete mass. Kidneys are within normal limits.  No hydronephrosis. Bladder  is within normal limits. Stomach/Bowel: Distended stomach. Multiple loops of dilated small bowel in the central abdomen, gradually tapering to decompressed loops of small bowel in the right lower quadrant. Possible transition point in the right mid abdomen (series 8/image 43). This appearance is considered worrisome for at least partial small bowel obstruction. Appendix is grossly unremarkable (series 8/image 32). Colon is relatively decompressed. Vascular/Lymphatic: No evidence of abdominal aortic aneurysm. Atherosclerotic calcifications of the abdominal aorta and branch vessels. No suspicious  abdominopelvic lymphadenopathy. Reproductive: Prostate is unremarkable. Other: Small volume abdominopelvic ascites. Associated peritoneal disease/omental caking, for example beneath the right lateral abdominal wall (series 8/image 40) and beneath the lower anterior abdominopelvic wall (series 8/image 64). Musculoskeletal: Visualized osseous structures are within normal limits. Review of the MIP images confirms the above findings. IMPRESSION: No evidence of pulmonary embolism. Mildly dilated loops of small bowel in the central abdomen, with possible transition point in the right mid abdomen, worrisome for at least partial small bowel obstruction. Suspected radiation changes in the left upper lobe and bilateral perihilar regions. Superimposed patchy/masslike opacities in the bilateral lower lobes are worrisome for tumor, less likely infection. 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 Small left pleural effusion. Left adrenal metastasis. Associated peritoneal disease/omental caking and abdominopelvic ascites. Consider correlation with prior outside hospital studies for more reliable assessment of the patient's oncologic status. Electronically Signed   By: Julian Hy M.D.   On: 08/13/2018 12:54   Dg Abd 2 Views  Result Date: 08/13/2018 CLINICAL DATA:  Abdominal pain EXAM: ABDOMEN - 2 VIEW COMPARISON:  None. FINDINGS: Supine and upright images obtained. There is air throughout much of bowel. There are several loops of borderline dilated small bowel without air-fluid levels. No free air. Postoperative changes noted in the scrotal regions. There are small phleboliths in the pelvis. Visualized lung bases are essentially clear. IMPRESSION: Slight dilatation of the small bowel loops without air-fluid levels. Suspect a degree of ileus or possible enteritis. Bowel obstruction felt to be less likely. Air is noted in portions of colon and rectum. No  free air demonstrable. Electronically Signed   By: Lowella Grip III M.D.   On: 08/13/2018 09:13    EKG: Sinus rhythm with right ventricular hypertrophy  Weights: Filed Weights   08/13/18 0803  Weight: 97.5 kg     Physical Exam: Blood pressure (!) 118/92, pulse 94, temperature (!) 97.4 F (36.3 C), temperature source Oral, resp. rate 20, height 6\' 2"  (1.88 m), weight 97.5 kg, SpO2 96 %. Body mass index is 27.6 kg/m. General: Well developed, well nourished, in no acute distress. Head eyes ears nose throat: Normocephalic, atraumatic, sclera non-icteric, no xanthomas, nares are without discharge. No apparent thyromegaly and/or mass  Lungs: Normal respiratory effort.  Diffuse wheezes, no rales, some rhonchi.  Heart: RRR with normal S1 S2. no murmur gallop, no rub, PMI is normal size and placement, carotid upstroke normal without bruit, jugular venous pressure is normal Abdomen: Soft, non-tender, non-distended with normoactive bowel sounds. No hepatomegaly. No rebound/guarding. No obvious abdominal masses. Abdominal aorta is normal size without bruit Extremities: Trace to 1+ edema. no cyanosis, no clubbing, no ulcers  Peripheral : 2+ bilateral upper extremity pulses, 2+ bilateral femoral pulses, 2+ bilateral dorsal pedal pulse Neuro: Alert and oriented. No facial asymmetry. No focal deficit. Moves all extremities spontaneously. Musculoskeletal: Normal muscle tone without kyphosis Psych:  Responds to questions appropriately with a normal affect.    Assessment: 67 year old male with lung cancer  respiratory failure chronic kidney disease with electrolyte abnormalities hypoxia and pain with illness likely causing multifocal PVCs which appear to be benign without evidence of heart failure and or acute coronary syndrome  Plan: 1.  Continue supportive care of respiratory failure hypoxia and electrolyte abnormalities 2.  No further investigation of multifocal PVCs likely secondary to  above 3.  No further cardiac diagnostics due to no evidence of angina heart failure or acute coronary syndrome 4.  Call if further questions or other significant rhythm disturbances  Signed, Corey Skains M.D. [facility] 08/13/2018, 6:02 PM

## 2018-08-13 NOTE — ED Notes (Signed)
Pt given warm blanket at this time. Resting in bed with family at bedside, will continue to monitor for further patient needs.

## 2018-08-13 NOTE — Consult Note (Signed)
SURGICAL CONSULTATION NOTE   HISTORY OF PRESENT ILLNESS (HPI):  67 y.o. male presented to [facility] ED for evaluation of nausea and vomiting. Hitory take from patient and patient's wife. Patient reports having abdominal discomfort since a week ago associated with bloating, distention and heaviness but yesterday afternoon started with nausea and vomiting. No pain radiation. Nothing improves or aggravates the ain. Patient refers has passes lots of gasses but not today. CT scan was done at the ED and was found with suspected small bowel obstruction due to dilation of some loops of small bowel. Images were personally reviewed. There is no free air. There is ascites. Implants were not appreciated on this CT scan but patients's wife refers he was found with intraabdominal metastasis on previous CT scan. Ascites most likely due to malignancy.  The patient has a complex oncologic history due to lung cancer. A week and a half ago he was diagnosed with intraabdominal metastasis and with adrenal tumors. He has bee receiving chemotherapy for lung cancer. Recently feeling significantly week. Wife refers that patient is not able to ambulate much due to weakness. Sometimes able to sit in a chair.  Surgery is consulted by Dr. Bridgett Larsson in this context for evaluation and management of small bowel obstruction.  PAST MEDICAL HISTORY (PMH):  Past Medical History:  Diagnosis Date  . Cancer ([place])    lung     PAST SURGICAL HISTORY ([place]):  Past Surgical History:  Procedure Laterality Date  . FINGER SURGERY Left      MEDICATIONS:  Prior to Admission medications   Medication Sig Start Date End Date Taking? Authorizing Provider  albuterol (PROVENTIL HFA;VENTOLIN HFA) 108 (90 Base) MCG/ACT inhaler Inhale 2 puffs into the lungs every 6 (six) hours as needed for wheezing or shortness of breath.   Yes [provider]  docusate sodium (COLACE) 100 MG capsule Take 100 mg by mouth 2 (two) times daily.   Yes [provider]  HYDROcodone-homatropine (HYCODAN) 5-1.5 MG/5ML syrup Take 5 mLs by mouth every 6 (six) hours as needed for cough.   Yes [provider]  HYDROmorphone (DILAUDID) 2 MG tablet Take 2 mg by mouth every 3 (three) hours as needed for severe pain.   Yes [provider]  lactulose (CHRONULAC) 10 GM/15ML solution Take 30 mLs by mouth 3 (three) times daily as needed. 07/31/18  Yes [provider]  lidocaine (XYLOCAINE) 2 % solution Use as directed 20 mLs in the mouth or throat as needed for mouth pain.   Yes [provider]  mirtazapine (REMERON) 15 MG tablet Take 15 mg by mouth at bedtime.   Yes [provider]  Morphine Sulfate ER 15 MG T12A Take 15 mg by mouth every 12 (twelve) hours.    Yes [provider]  omega-3 acid ethyl esters (LOVAZA) 1 g capsule Take 1 g by mouth daily.   Yes [provider]  omeprazole (PRILOSEC) 20 MG capsule Take 20 mg by mouth 2 (two) times daily before a meal.   Yes [provider]  oxyCODONE (OXY IR/ROXICODONE) 5 MG immediate release tablet Take 5-10 mg by mouth every 4 (four) hours as needed for severe pain.   Yes [provider]  predniSONE (DELTASONE) 10 MG tablet Take 10 mg by mouth every morning.   Yes [provider]  prochlorperazine (COMPAZINE) 10 MG tablet Take 10 mg by mouth every 6 (six) hours as needed for nausea or vomiting.   Yes [provider]  senna (  SENOKOT) 8.6 MG TABS tablet Take 1 tablet by mouth daily.   Yes [provider]  dextromethorphan (DELSYM) 30 MG/5ML liquid Take 5 mLs (30 mg total) by mouth 2 (two) times daily. Patient not taking: Reported on 08/13/2018 04/02/17   Fritzi Mandes, MD  guaiFENesin (MUCINEX) 600 MG 12 hr tablet Take 1 tablet (600 mg total) by mouth 2 (two) times daily. Patient not taking: Reported on 08/13/2018 04/02/17   Fritzi Mandes, MD     ALLERGIES:  Allergies  Allergen Reactions  . Statins Rash    Pain in  legs, weakness Pain in legs, weakness     SOCIAL HISTORY:  Social History   Socioeconomic History  . Marital status: Married    Spouse name: Not on file  . Number of children: Not on file  . Years of education: Not on file  . Highest education level: Not on file  Occupational History  . Not on file  Social Needs  . Financial resource strain: Not on file  . Food insecurity:    Worry: Not on file    Inability: Not on file  . Transportation needs:    Medical: Not on file    Non-medical: Not on file  Tobacco Use  . Smoking status: Former Research scientist (life sciences)  . Smokeless tobacco: Never Used  Substance and Sexual Activity  . Alcohol use: No    Comment: occ  . Drug use: No  . Sexual activity: Not on file  Lifestyle  . Physical activity:    Days per week: Not on file    Minutes per session: Not on file  . Stress: Not on file  Relationships  . Social connections:    Talks on phone: Not on file    Gets together: Not on file    Attends religious service: Not on file    Active member of club or organization: Not on file    Attends meetings of clubs or organizations: Not on file    Relationship status: Not on file  . Intimate partner violence:    Fear of current or ex partner: Not on file    Emotionally abused: Not on file    Physically abused: Not on file    Forced sexual activity: Not on file  Other Topics Concern  . Not on file  Social History Narrative  . Not on file    The patient currently resides (home / rehab facility / nursing home): Home The patient normally is (ambulatory / bedbound): Most of the time in bed due to weakness. Able to sit in chair with assistance.    FAMILY HISTORY:  Family History  Problem Relation Age of Onset  . Lung cancer Father   . Colon cancer Brother      REVIEW OF SYSTEMS:  Constitutional: denies weight loss, fever, chills, or sweats. Positive for malaise, fatigue and weakness.  Eyes: denies any other vision changes, history of eye injury   ENT: denies sore throat, hearing problems  Respiratory: positive for shortness of breath, wheezing  Cardiovascular: denies chest pain, palpitations  Gastrointestinal: positive for abdominal pain, N/V, Positive for diarrhea Genitourinary: denies burning with urination or urinary frequency Musculoskeletal: denies any other joint pains or cramps  Skin: denies any other rashes or skin discolorations  Neurological: denies any other headache, dizziness, weakness  Psychiatric: denies any other depression, anxiety   All other review of systems were negative   VITAL SIGNS:  Temp:  [97.4 F (36.3 C)] 97.4 F (36.3 C) (08/14  1610) Pulse Rate:  [84-106] 94 (08/14 1610) Resp:  [11-28] 20 (08/14 1610) BP: (92-118)/(71-92) 118/92 (08/14 1610) SpO2:  [86 %-100 %] 97 % (08/14 1944) Weight:  [97.5 kg] 97.5 kg (08/14 0803)     Height: 6\' 2"  (188 cm) Weight: 97.5 kg BMI (Calculated): 27.59   INTAKE/OUTPUT:  This shift: No intake/output data recorded.  Last 2 shifts: @IOLAST2SHIFTS @   PHYSICAL EXAM:  Constitutional:  -- Normal body habitus  -- Awake, alert, and oriented x3  Eyes:  -- Pupils equally round and reactive to light  -- No scleral icterus  Ear, nose, and throat:  -- No jugular venous distension  Pulmonary:  -- Bilateral crackles  -- Breathing non-labored at rest Cardiovascular:  -- S1, S2 present  -- No pericardial rubs Gastrointestinal:  -- Abdomen soft, mild tender, distended, dull to percussion no guarding or rebound tenderness -- No abdominal masses appreciated, pulsatile or otherwise  Musculoskeletal and Integumentary:  -- Wounds or skin discoloration: None appreciated -- Extremities: B/L UE and LE FROM, hands and feet warm, bilateral lower extremity edema  Neurologic:  -- Motor function: intact and symmetric. Significant bilateral motor weakness -- Sensation: intact and symmetric   Labs:  CBC Latest Ref Rng & Units 08/13/2018 04/01/2017 03/31/2017  WBC 3.8 - 10.6 K/uL  5.1 2.9(L) 3.9  Hemoglobin 13.0 - 18.0 g/dL 12.6(L) 10.4(L) 12.3(L)  Hematocrit 40.0 - 52.0 % 36.3(L) 29.6(L) 34.4(L)  Platelets 150 - 440 K/uL 91(L) 130(L) 188   CMP Latest Ref Rng & Units 08/13/2018 04/02/2017 04/01/2017  Glucose 70 - 99 mg/dL 176(H) 95 94  BUN 8 - 23 mg/dL 41(H) 15 22(H)  Creatinine 0.61 - 1.24 mg/dL 1.40(H) 0.93 0.79  Sodium 135 - 145 mmol/L 137 131(L) 132(L)  Potassium 3.5 - 5.1 mmol/L 4.3 4.1 3.4(L)  Chloride 98 - 111 mmol/L 95(L) 100(L) 101  CO2 22 - 32 mmol/L 24 24 23   Calcium 8.9 - 10.3 mg/dL 9.4 8.2(L) 7.6(L)  Total Protein 6.5 - 8.1 g/dL 7.5 - -  Total Bilirubin 0.3 - 1.2 mg/dL 1.0 - -  Alkaline Phos 38 - 126 U/L 68 - -  AST 15 - 41 U/L 37 - -  ALT 0 - 44 U/L 16 - -    Imaging studies:  EXAM: CT ANGIOGRAPHY CHEST  CT ABDOMEN AND PELVIS WITH CONTRAST  TECHNIQUE: Multidetector CT imaging of the chest was performed using the standard protocol during bolus administration of intravenous contrast. Multiplanar CT image reconstructions and MIPs were obtained to evaluate the vascular anatomy. Multidetector CT imaging of the abdomen and pelvis was performed using the standard protocol during bolus administration of intravenous contrast.  CONTRAST:  14mL ISOVUE-370 IOPAMIDOL (ISOVUE-370) INJECTION 76%  COMPARISON:  None.  FINDINGS: CTA CHEST FINDINGS  Cardiovascular: Satisfactory opacification the bilateral pulmonary artery to the lobar level. No evidence of pulmonary embolism.  Cardiomegaly.  No pericardial effusion.  4.3 cm thoracic aortic aneurysm at the level of the aortic root (series 6/image 144).  Mediastinum/Nodes: No suspicious mediastinal lymphadenopathy.  Bilateral perihilar regions are poorly evaluated.  Visualized thyroid is unremarkable.  Lungs/Pleura: Left upper lobe atelectasis/collapse with air bronchograms. Mild masslike opacity inferiorly in the lingula (series 5/image 40).  Additional patchy/masslike opacity in  the central left lower infrahilar region (series 5/image 37) and inferior left lower lobe (series 7/image 49). Moderate left pleural effusion.  Right perihilar opacity, favoring radiation changes (series 7/image 32). Additional nodular/masslike opacity in the medial right lower lobe (series 5/image 35).  Mild interlobular  septal thickening in the lungs bilaterally. Mild subpleural nodularity in the lungs bilaterally, measuring up to 9 mm in the right middle lobe (series 7/image 44).  Trace right pleural effusion.  No pneumothorax.  Overall, in the absence of prior imaging, it is difficult to discern to what degree this reflects post treatment changes versus tumor versus acute infection. However, radiation changes in the left upper lobe and bilateral perihilar regions are suspected. Superimposed tumor in the medial right lower lobe and left lower lobe are not excluded, although infection could have a similar appearance. Lymphangitic spread of tumor is suspected in the lungs bilaterally, with additional subpleural pulmonary metastases.  Musculoskeletal: Mild degenerative changes the lower thoracic spine.  Review of the MIP images confirms the above findings.  CT ABDOMEN and PELVIS FINDINGS  Hepatobiliary: Liver is within normal limits. No suspicious/enhancing hepatic lesions.  Gallbladder is unremarkable. No intrahepatic or extrahepatic ductal dilatation.  Pancreas: Within normal limits.  Spleen: Within normal limits.  Adrenals/Urinary Tract: 2.2 cm left adrenal metastasis. Mild thickening of the right adrenal gland without discrete mass.  Kidneys are within normal limits.  No hydronephrosis.  Bladder is within normal limits.  Stomach/Bowel: Distended stomach.  Multiple loops of dilated small bowel in the central abdomen, gradually tapering to decompressed loops of small bowel in the right lower quadrant. Possible transition point in the right mid  abdomen (series 8/image 43). This appearance is considered worrisome for at least partial small bowel obstruction.  Appendix is grossly unremarkable (series 8/image 32).  Colon is relatively decompressed.  Vascular/Lymphatic: No evidence of abdominal aortic aneurysm.  Atherosclerotic calcifications of the abdominal aorta and branch vessels.  No suspicious abdominopelvic lymphadenopathy.  Reproductive: Prostate is unremarkable.  Other: Small volume abdominopelvic ascites.  Associated peritoneal disease/omental caking, for example beneath the right lateral abdominal wall (series 8/image 40) and beneath the lower anterior abdominopelvic wall (series 8/image 64).  Musculoskeletal: Visualized osseous structures are within normal limits.  Review of the MIP images confirms the above findings.  IMPRESSION: No evidence of pulmonary embolism.  Mildly dilated loops of small bowel in the central abdomen, with possible transition point in the right mid abdomen, worrisome for at least partial small bowel obstruction.  Suspected radiation changes in the left upper lobe and bilateral perihilar regions. Superimposed patchy/masslike opacities in the bilateral lower lobes are worrisome for tumor, less likely infection. 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 Small left pleural effusion.  Left adrenal metastasis. Associated peritoneal disease/omental caking and abdominopelvic ascites.  Consider correlation with prior outside hospital studies for more reliable assessment of the patient's oncologic status.   Electronically Signed   By: Julian Hy M.D.   On: 08/13/2018 12:54   Assessment/Plan: 67 y.o. 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  Patient with a complex clinical status of nausea and vomiting. There is a suspected small bowel obstruction on CT scan. Recently patient was diagnosed with metastatic disease with omental caking on CT scan. New CT scan today show some small bowel loops dilates with abundant air on the transverse colon and rectum. The difficulty of the case in this patient with no previous abdominal surgery is that this may be caused by the peritoneal and omental implants which are hardly treated with surgery. I discussed with patient and his wife at length about his condition. I recommended to start conservative management  of IV hydration since patient is dehydrated due to vomiting and poor oral intake with creatine in 1.4 from 0.9. Will keep electrolytes optimized. Will place NGT if patient continue with vomiting or images show worsening small bowel dilation. Patient oriented that sometimes if conservative management does not improve, surgery may be needed to resolve the obstruction but in his case may be difficult and sometimes exploratory laparotomies cannot be completed due to the amount of peritoneal implants.  Patient and his wife both agreed that they does not want any surgical intervention at this moment and will not consent for any surgical intervention before talking to his primary oncology who is at [facility].  I agree with Primary Care team to continue to try to transfer patient to [place] for further management as this is patient's wishes were he feels comfortable with the Oncologist that has been treating him from the beginning of his disease.  Will follow patient while he is admitted in this hospital and will give the best of my recommendations.   Arnold Long, MD

## 2018-08-14 ENCOUNTER — Inpatient Hospital Stay: Payer: Medicare Other

## 2018-08-14 LAB — BASIC METABOLIC PANEL
Anion gap: 9 (ref 5–15)
BUN: 45 mg/dL — AB (ref 8–23)
CALCIUM: 8.9 mg/dL (ref 8.9–10.3)
CO2: 29 mmol/L (ref 22–32)
Chloride: 101 mmol/L (ref 98–111)
Creatinine, Ser: 1.19 mg/dL (ref 0.61–1.24)
GFR calc Af Amer: >60 mL/min (ref >60)
GLUCOSE: 108 mg/dL — AB (ref 70–99)
POTASSIUM: 4.8 mmol/L (ref 3.5–5.1)
Sodium: 139 mmol/L (ref 135–145)

## 2018-08-14 LAB — CBC
HEMATOCRIT: 28.9 % — AB (ref 40.0–52.0)
HEMOGLOBIN: 10.2 g/dL — AB (ref 13.0–18.0)
MCH: 35.7 pg — AB (ref 26.0–34.0)
MCHC: 35.4 g/dL (ref 32.0–36.0)
MCV: 100.8 fL — ABNORMAL HIGH (ref 80.0–100.0)
Platelets: 70 10*3/uL — ABNORMAL LOW (ref 150–440)
RBC: 2.87 MIL/uL — ABNORMAL LOW (ref 4.40–5.90)
RDW: 14.8 % — AB (ref 11.5–14.5)
WBC: 2.7 10*3/uL — ABNORMAL LOW (ref 3.8–10.6)

## 2018-08-14 LAB — URINALYSIS, COMPLETE (UACMP) WITH MICROSCOPIC
Bilirubin Urine: NEGATIVE
Glucose, UA: NEGATIVE mg/dL
HGB URINE DIPSTICK: NEGATIVE
KETONES UR: NEGATIVE mg/dL
LEUKOCYTES UA: NEGATIVE
NITRITE: NEGATIVE
PROTEIN: NEGATIVE mg/dL
SPECIFIC GRAVITY, URINE: 1.043 — AB (ref 1.005–1.030)
pH: 5 (ref 5.0–8.0)

## 2018-08-14 MED ORDER — METHYLPREDNISOLONE SODIUM SUCC 40 MG IJ SOLR
20.0000 mg | Freq: Every day | INTRAMUSCULAR | Status: DC
  Administered 2018-08-14 – 2018-08-16 (×3): 20 mg via INTRAVENOUS
  Filled 2018-08-14 (×3): qty 1

## 2018-08-14 NOTE — Progress Notes (Signed)
Patient and family asking about food.  I gave them education that we are unable to feed patient d/t not having any bowel sounds and having a SBO.  [place] to see patient.

## 2018-08-14 NOTE — Consult Note (Signed)
[facility] Day(s): 1.   Post op day(s):  Marland Kitchen   Interval History: Patient seen and examined, no acute events or new complaints overnight. Patient reports still with difficulty breathing. Denies nausea or vomiting since my last evaluation yesterday.   Vital signs in last 24 hours: [min-max] current  Temp:  [97.4 F (36.3 C)-97.9 F (36.6 C)] 97.8 F (36.6 C) (08/15 0535) Pulse Rate:  [79-98] 79 (08/15 0535) Resp:  [11-28] 18 (08/15 0535) BP: (92-118)/(71-92) 106/74 (08/15 0535) SpO2:  [86 %-100 %] 97 % (08/15 0535)     Height: 6\' 2"  (188 cm) Weight: 97.5 kg BMI (Calculated): 27.59    Physical Exam:  Constitutional: alert, cooperative and no distress  Respiratory: breathing non-labored at rest  Cardiovascular: regular rate and sinus rhythm  Gastrointestinal: soft, mild-tender, and mild-distended  Labs:  CBC Latest Ref Rng & Units 08/14/2018 08/13/2018 04/01/2017  WBC 3.8 - 10.6 K/uL 2.7(L) 5.1 2.9(L)  Hemoglobin 13.0 - 18.0 g/dL 10.2(L) 12.6(L) 10.4(L)  Hematocrit 40.0 - 52.0 % 28.9(L) 36.3(L) 29.6(L)  Platelets 150 - 440 K/uL 70(L) 91(L) 130(L)   CMP Latest Ref Rng & Units 08/14/2018 08/13/2018 04/02/2017  Glucose 70 - 99 mg/dL 108(H) 176(H) 95  BUN 8 - 23 mg/dL 45(H) 41(H) 15  Creatinine 0.61 - 1.24 mg/dL 1.19 1.40(H) 0.93  Sodium 135 - 145 mmol/L 139 137 131(L)  Potassium 3.5 - 5.1 mmol/L 4.8 4.3 4.1  Chloride 98 - 111 mmol/L 101 95(L) 100(L)  CO2 22 - 32 mmol/L 29 24 24   Calcium 8.9 - 10.3 mg/dL 8.9 9.4 8.2(L)  Total Protein 6.5 - 8.1 g/dL - 7.5 -  Total Bilirubin 0.3 - 1.2 mg/dL - 1.0 -  Alkaline Phos 38 - 126 U/L - 68 -  AST 15 - 41 U/L - 37 -  ALT 0 - 44 U/L - 16 -    Imaging studies: No new pertinent imaging studies   Assessment/Plan:  67 y.o. 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   Regarding the suspected small bowel obstruction, there is no clinical deterioration but patient cannot recall if he has pass flatus. Decrease in WBC count, Hemoglobin, platelets, and creatinine consistent with better hydration. Again discussed the need of NGT if vomit recurs of any imaging show worsening bowel dilation. Again patient refers that does not want surgical management until he has a discussion with primary oncologist. Still wishes to be transferred if possible. No acute surgical management needed at this moment. Will continue to follow.   Arnold Long, MD

## 2018-08-14 NOTE — Progress Notes (Signed)
Patient alert and oriented, having some pain in abdomen but relieved with pain medication.  Upon initial assessment this AM, no bowel sounds were present but this afternoon there were hypoactive sounds in RUQ.  Patient also passing gas.

## 2018-08-14 NOTE — Progress Notes (Signed)
[facility] NAME: Nathaniel Dixon    MR#:  [id number]  DATE OF BIRTH:  [birth year]  SUBJECTIVE:  CHIEF COMPLAINT:   Chief Complaint  Patient presents with  . Shortness of Breath   -Came in with bowel obstruction.  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  REVIEW OF SYSTEMS:  Review of Systems  Constitutional: Negative for chills, fever and malaise/fatigue.  HENT: Negative for congestion, ear discharge, hearing loss and nosebleeds.   Eyes: Negative for blurred vision and double vision.  Respiratory: Positive for cough and shortness of breath. Negative for wheezing.   Cardiovascular: Negative for chest pain and palpitations.  Gastrointestinal: Positive for abdominal pain and nausea. Negative for constipation, diarrhea and vomiting.  Genitourinary: Negative for dysuria.  Musculoskeletal: Negative for myalgias.  Neurological: Negative for dizziness, focal weakness, seizures, weakness and headaches.  Psychiatric/Behavioral: Negative for depression.    DRUG ALLERGIES:   Allergies  Allergen Reactions  . Statins Rash    Pain in legs, weakness Pain in legs, weakness    VITALS:  Blood pressure 108/79, pulse 79, temperature 98.3 F (36.8 C), temperature source Oral, resp. rate 16, height 6\' 2"  (1.88 m), weight 97.5 kg, SpO2 99 %.  PHYSICAL EXAMINATION:  Physical Exam   GENERAL:  67 y.o.-year-old patient lying in the bed with no acute distress.  EYES: Pupils equal, round, reactive to light and accommodation. No scleral icterus. Extraocular muscles intact.  HEENT: Head atraumatic, normocephalic. Oropharynx and nasopharynx clear.  NECK:  Supple, no jugular venous distention. No thyroid enlargement, no tenderness.  LUNGS: Normal breath sounds bilaterally, no wheezing or crepitation. No use of accessory muscles of respiration. Coarse bibasilar breath sounds CARDIOVASCULAR: S1, S2 normal. No murmurs, rubs, or gallops.    ABDOMEN: Soft, distended with hypoactive bowel sounds. Bowel sounds present. No organomegaly or mass.  EXTREMITIES: No pedal edema, cyanosis, or clubbing.  NEUROLOGIC: Cranial nerves II through XII are intact. Muscle strength 5/5 in all extremities. Sensation intact. Gait not checked.  PSYCHIATRIC: The patient is alert and oriented x 3.  SKIN: No obvious rash, lesion, or ulcer.    LABORATORY PANEL:   CBC Recent Labs  Lab 08/14/18 0407  WBC 2.7*  HGB 10.2*  HCT 28.9*  PLT 70*   ------------------------------------------------------------------------------------------------------------------  Chemistries  Recent Labs  Lab 08/13/18 0810 08/14/18 0407  NA 137 139  K 4.3 4.8  CL 95* 101  CO2 24 29  GLUCOSE 176* 108*  BUN 41* 45*  CREATININE 1.40* 1.19  CALCIUM 9.4 8.9  MG 1.8  --   AST 37  --   ALT 16  --   ALKPHOS 68  --   BILITOT 1.0  --    ------------------------------------------------------------------------------------------------------------------  Cardiac Enzymes Recent Labs  Lab 08/13/18 0810  TROPONINI <0.03   ------------------------------------------------------------------------------------------------------------------  RADIOLOGY:  Dg Chest 2 View  Result Date: 08/13/2018 CLINICAL DATA:  Right chest/rib pain.  Shortness of breath. EXAM: CHEST - 2 VIEW COMPARISON:  03/31/2017 FINDINGS: Very low lung volumes. Vascular congestion and perihilar opacities with interstitial prominence, left greater than right could reflect asymmetric edema or infection. No visible significant effusions. No acute bony abnormality. No visible rib fracture or pneumothorax. IMPRESSION: Very low lung volumes with vascular congestion and interstitial prominence throughout the lungs, left greater than right. This could reflect asymmetric edema or infection. No visible displaced rib fracture or pneumothorax. Electronically Signed   By: Rolm Baptise M.D.   On: 08/13/2018 08:53  Ct  Angio Chest Pe W And/or Wo Contrast  Result Date: 08/13/2018 CLINICAL DATA:  Non-small cell lung cancer, adrenal/peritoneal metastases. Shortness of breath. EXAM: CT ANGIOGRAPHY CHEST CT ABDOMEN AND PELVIS WITH CONTRAST TECHNIQUE: Multidetector CT imaging of the chest was performed using the standard protocol during bolus administration of intravenous contrast. Multiplanar CT image reconstructions and MIPs were obtained to evaluate the vascular anatomy. Multidetector CT imaging of the abdomen and pelvis was performed using the standard protocol during bolus administration of intravenous contrast. CONTRAST:  158mL ISOVUE-370 IOPAMIDOL (ISOVUE-370) INJECTION 76% COMPARISON:  None. FINDINGS: CTA CHEST FINDINGS Cardiovascular: Satisfactory opacification the bilateral pulmonary artery to the lobar level. No evidence of pulmonary embolism. Cardiomegaly.  No pericardial effusion. 4.3 cm thoracic aortic aneurysm at the level of the aortic root (series 6/image 144). Mediastinum/Nodes: No suspicious mediastinal lymphadenopathy. Bilateral perihilar regions are poorly evaluated. Visualized thyroid is unremarkable. Lungs/Pleura: Left upper lobe atelectasis/collapse with air bronchograms. Mild masslike opacity inferiorly in the lingula (series 5/image 40). Additional patchy/masslike opacity in the central left lower infrahilar region (series 5/image 37) and inferior left lower lobe (series 7/image 49). Moderate left pleural effusion. Right perihilar opacity, favoring radiation changes (series 7/image 32). Additional nodular/masslike opacity in the medial right lower lobe (series 5/image 35). Mild interlobular septal thickening in the lungs bilaterally. Mild subpleural nodularity in the lungs bilaterally, measuring up to 9 mm in the right middle lobe (series 7/image 44). Trace right pleural effusion.  No pneumothorax. Overall, in the absence of prior imaging, it is difficult to discern to what degree this reflects post treatment  changes versus tumor versus acute infection. However, radiation changes in the left upper lobe and bilateral perihilar regions are suspected. Superimposed tumor in the medial right lower lobe and left lower lobe are not excluded, although infection could have a similar appearance. Lymphangitic spread of tumor is suspected in the lungs bilaterally, with additional subpleural pulmonary metastases. Musculoskeletal: Mild degenerative changes the lower thoracic spine. Review of the MIP images confirms the above findings. CT ABDOMEN and PELVIS FINDINGS Hepatobiliary: Liver is within normal limits. No suspicious/enhancing hepatic lesions. Gallbladder is unremarkable. No intrahepatic or extrahepatic ductal dilatation. Pancreas: Within normal limits. Spleen: Within normal limits. Adrenals/Urinary Tract: 2.2 cm left adrenal metastasis. Mild thickening of the right adrenal gland without discrete mass. Kidneys are within normal limits.  No hydronephrosis. Bladder is within normal limits. Stomach/Bowel: Distended stomach. Multiple loops of dilated small bowel in the central abdomen, gradually tapering to decompressed loops of small bowel in the right lower quadrant. Possible transition point in the right mid abdomen (series 8/image 43). This appearance is considered worrisome for at least partial small bowel obstruction. Appendix is grossly unremarkable (series 8/image 32). Colon is relatively decompressed. Vascular/Lymphatic: No evidence of abdominal aortic aneurysm. Atherosclerotic calcifications of the abdominal aorta and branch vessels. No suspicious abdominopelvic lymphadenopathy. Reproductive: Prostate is unremarkable. Other: Small volume abdominopelvic ascites. Associated peritoneal disease/omental caking, for example beneath the right lateral abdominal wall (series 8/image 40) and beneath the lower anterior abdominopelvic wall (series 8/image 64). Musculoskeletal: Visualized osseous structures are within normal limits.  Review of the MIP images confirms the above findings. IMPRESSION: No evidence of pulmonary embolism. Mildly dilated loops of small bowel in the central abdomen, with possible transition point in the right mid abdomen, worrisome for at least partial small bowel obstruction. Suspected radiation changes in the left upper lobe and bilateral perihilar regions. Superimposed patchy/masslike opacities in the bilateral lower lobes are worrisome for tumor, less likely infection.  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 Small left pleural effusion. Left adrenal metastasis. Associated peritoneal disease/omental caking and abdominopelvic ascites. Consider correlation with prior outside hospital studies for more reliable assessment of the patient's oncologic status. Electronically Signed   By: Julian Hy M.D.   On: 08/13/2018 12:54   Ct Abdomen Pelvis W Contrast  Result Date: 08/13/2018 CLINICAL DATA:  Non-small cell lung cancer, adrenal/peritoneal metastases. Shortness of breath. EXAM: CT ANGIOGRAPHY CHEST CT ABDOMEN AND PELVIS WITH CONTRAST TECHNIQUE: Multidetector CT imaging of the chest was performed using the standard protocol during bolus administration of intravenous contrast. Multiplanar CT image reconstructions and MIPs were obtained to evaluate the vascular anatomy. Multidetector CT imaging of the abdomen and pelvis was performed using the standard protocol during bolus administration of intravenous contrast. CONTRAST:  181mL ISOVUE-370 IOPAMIDOL (ISOVUE-370) INJECTION 76% COMPARISON:  None. FINDINGS: CTA CHEST FINDINGS Cardiovascular: Satisfactory opacification the bilateral pulmonary artery to the lobar level. No evidence of pulmonary embolism. Cardiomegaly.  No pericardial effusion. 4.3 cm thoracic aortic aneurysm at the level of the aortic root (series 6/image 144). Mediastinum/Nodes: No suspicious mediastinal lymphadenopathy.  Bilateral perihilar regions are poorly evaluated. Visualized thyroid is unremarkable. Lungs/Pleura: Left upper lobe atelectasis/collapse with air bronchograms. Mild masslike opacity inferiorly in the lingula (series 5/image 40). Additional patchy/masslike opacity in the central left lower infrahilar region (series 5/image 37) and inferior left lower lobe (series 7/image 49). Moderate left pleural effusion. Right perihilar opacity, favoring radiation changes (series 7/image 32). Additional nodular/masslike opacity in the medial right lower lobe (series 5/image 35). Mild interlobular septal thickening in the lungs bilaterally. Mild subpleural nodularity in the lungs bilaterally, measuring up to 9 mm in the right middle lobe (series 7/image 44). Trace right pleural effusion.  No pneumothorax. Overall, in the absence of prior imaging, it is difficult to discern to what degree this reflects post treatment changes versus tumor versus acute infection. However, radiation changes in the left upper lobe and bilateral perihilar regions are suspected. Superimposed tumor in the medial right lower lobe and left lower lobe are not excluded, although infection could have a similar appearance. Lymphangitic spread of tumor is suspected in the lungs bilaterally, with additional subpleural pulmonary metastases. Musculoskeletal: Mild degenerative changes the lower thoracic spine. Review of the MIP images confirms the above findings. CT ABDOMEN and PELVIS FINDINGS Hepatobiliary: Liver is within normal limits. No suspicious/enhancing hepatic lesions. Gallbladder is unremarkable. No intrahepatic or extrahepatic ductal dilatation. Pancreas: Within normal limits. Spleen: Within normal limits. Adrenals/Urinary Tract: 2.2 cm left adrenal metastasis. Mild thickening of the right adrenal gland without discrete mass. Kidneys are within normal limits.  No hydronephrosis. Bladder is within normal limits. Stomach/Bowel: Distended stomach. Multiple  loops of dilated small bowel in the central abdomen, gradually tapering to decompressed loops of small bowel in the right lower quadrant. Possible transition point in the right mid abdomen (series 8/image 43). This appearance is considered worrisome for at least partial small bowel obstruction. Appendix is grossly unremarkable (series 8/image 32). Colon is relatively decompressed. Vascular/Lymphatic: No evidence of abdominal aortic aneurysm. Atherosclerotic calcifications of the abdominal aorta and branch vessels. No suspicious abdominopelvic lymphadenopathy. Reproductive: Prostate is unremarkable. Other: Small volume abdominopelvic ascites. Associated peritoneal disease/omental caking, for example beneath the right lateral abdominal wall (series 8/image 40) and beneath the lower anterior abdominopelvic wall (series 8/image 64). Musculoskeletal: Visualized osseous structures are within normal limits. Review of the MIP images confirms the above findings. IMPRESSION: No evidence of pulmonary embolism.  Mildly dilated loops of small bowel in the central abdomen, with possible transition point in the right mid abdomen, worrisome for at least partial small bowel obstruction. Suspected radiation changes in the left upper lobe and bilateral perihilar regions. Superimposed patchy/masslike opacities in the bilateral lower lobes are worrisome for tumor, less likely infection. 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 Small left pleural effusion. Left adrenal metastasis. Associated peritoneal disease/omental caking and abdominopelvic ascites. Consider correlation with prior outside hospital studies for more reliable assessment of the patient's oncologic status. Electronically Signed   By: Julian Hy M.D.   On: 08/13/2018 12:54   Dg Abd 2 Views  Result Date: 08/13/2018 CLINICAL DATA:  Abdominal pain EXAM: ABDOMEN - 2 VIEW COMPARISON:  None.  FINDINGS: Supine and upright images obtained. There is air throughout much of bowel. There are several loops of borderline dilated small bowel without air-fluid levels. No free air. Postoperative changes noted in the scrotal regions. There are small phleboliths in the pelvis. Visualized lung bases are essentially clear. IMPRESSION: Slight dilatation of the small bowel loops without air-fluid levels. Suspect a degree of ileus or possible enteritis. Bowel obstruction felt to be less likely. Air is noted in portions of colon and rectum. No free air demonstrable. Electronically Signed   By: Lowella Grip III M.D.   On: 08/13/2018 09:13    EKG:   Orders placed or performed during the hospital encounter of 08/13/18  . ED EKG  . ED EKG  . EKG 12-Lead  . EKG 12-Lead  . 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  1.  Small bowel obstruction-no significant change clinically.  No nausea or vomiting today.  Patient wants to hold off on the NG tube at this time. -Follow-up KUB.  Appreciate surgical consult.  Conservative management at this time -No acute surgical indication at this time.  If surgery is needed-patient would like to be transferred to [facility].  He is on the waiting list. -Keep n.p.o. at this time  2.  Acute on chronic respiratory failure-secondary to radiation pneumonitis.  Also underlying lung cancer -Continue supplemental oxygen -Patient takes oral prednisone daily at home-changed to IV steroids here while he is n.p.o. -Also chest x-ray with some vascular congestion, decrease the rate of IV fluids.  3.  Non-small cell lung cancer-adenocarcinoma of the lung diagnosed at the end of 2017 -Status post chemo and radiation.  Finished chemotherapy in  May 2018.  Recent scans with adrenal nodule and omental mets, status post adrenal biopsy confirming metastatic disease. -Received first cycle of chemotherapy from that recently.  4.  Pancytopenia-likely from recent chemotherapy.  Continue to monitor closely.  Daily labs.  No indication for any transfusion at this time.  5.  DVT prophylaxis-teds and SCDs     All the records are reviewed and case discussed with Care Management/Social Workerr. Management plans discussed with the patient, family and they are in agreement.  CODE STATUS: Full Code  TOTAL TIME TAKING CARE OF THIS PATIENT: 38 minutes.   POSSIBLE D/C IN 2 DAYS, DEPENDING ON CLINICAL CONDITION.   Gladstone Lighter M.D on 08/14/2018 at 3:46 PM  Between 7am to 6pm - Pager - [phone]  After 6pm go to [email or web address] - [facility]  Office  [phone]  CC: Primary care physician; Gayland Curry, MD

## 2018-08-14 NOTE — Progress Notes (Addendum)
GI Inpatient Follow-up Note  HPI:  Nathaniel Dixon is a 67 y.o. male with hx of metastatic lung cancer to abdominal cavity and adrenals who was admitted yesterday for abdominal pain and nausea and vomiting. CT scan showed partial SBO.   Patient seen and examined this afternoon resting comfortably in hospital bed. Wife present in room. No acute events overnight. He does report some difficulty breathing and feeling short of breath. No bowel movements since admission. He does report continued abdominal pain today, 6/10 currently with no radiation of his pain. He has remained NPO. He does endorse passing gas today. Wife reports they received a message from pt's oncologist at [facility] and no beds are currently available.     Scheduled Inpatient Medications:  . bisacodyl  10 mg Rectal Daily  . ipratropium-albuterol  3 mL Nebulization Q6H    Continuous Inpatient Infusions:   . 0.9 % NaCl with KCl 20 mEq / L 100 mL/hr at 08/14/18 0418  . famotidine (PEPCID) IV Stopped (08/14/18 0913)    PRN Inpatient Medications:  acetaminophen **OR** acetaminophen, albuterol, bisacodyl, HYDROcodone-acetaminophen, morphine injection, ondansetron **OR** ondansetron (ZOFRAN) IV, senna-docusate, sodium phosphate  Review of Systems: Constitutional: Weight is stable.  Eyes: No changes in vision. ENT: No oral lesions, sore throat.  GI: see HPI.  Heme/Lymph: No easy bruising.  CV: No chest pain.  GU: No hematuria.  Integumentary: No rashes.  Neuro: No headaches.  Psych: No depression/anxiety.  Endocrine: No heat/cold intolerance.  Allergic/Immunologic: No urticaria.  Resp: +cough and + SOB.  Musculoskeletal: No joint swelling.    Physical Examination: BP 108/79   Pulse 79   Temp 98.3 F (36.8 C) (Oral)   Resp 16   Ht 6\' 2"  (1.88 m)   Wt 97.5 kg   SpO2 99%   BMI 27.60 kg/m  Gen: NAD, alert and oriented x 4 HEENT: PEERLA, EOMI, Neck: supple, no JVD or thyromegaly Chest: CTA bilaterally, no wheezes,  crackles, or other adventitious sounds CV: RRR, no m/g/c/r Abd: mildly distended, tender to palpation in epigastric region, bowel sounds heard in RUQ, very faint bowel sounds in lower abdomen; no HSM, guarding, ridigity, or rebound tenderness Ext: no edema, well perfused with 2+ pulses, Skin: no rash or lesions noted Lymph: no LAD  Data: Lab Results  Component Value Date   WBC 2.7 (L) 08/14/2018   HGB 10.2 (L) 08/14/2018   HCT 28.9 (L) 08/14/2018   MCV 100.8 (H) 08/14/2018   PLT 70 (L) 08/14/2018   Recent Labs  Lab 08/13/18 0810 08/14/18 0407  HGB 12.6* 10.2*   Lab Results  Component Value Date   NA 139 08/14/2018   K 4.8 08/14/2018   CL 101 08/14/2018   CO2 29 08/14/2018   BUN 45 (H) 08/14/2018   CREATININE 1.19 08/14/2018   Lab Results  Component Value Date   ALT 16 08/13/2018   AST 37 08/13/2018   ALKPHOS 68 08/13/2018   BILITOT 1.0 08/13/2018   No results for input(s): APTT, INR, PTT in the last 168 hours. Assessment/Plan: Mr. Grimaldo is a 67 y/o Caucasian male with a PMH of lung cancer with metastasis to peritoneal lining of abdomen and adrenal glands followed by [facility] admitted for partial small bowel obstruction  - No episodes of emesis since admission, nausea is well-controlled - Continue supportive care with conservative treatment. If nausea and vomiting return, consider NG tube per surgery recommendation  - Currently waiting bed at [facility] wishes to be transferred  if possible - No acute GI needs at this time. Please call us if coffee ground emesis returns.  - GI will sign off at this time. Please call us if we can be of assistance.   Please call with questions or concerns.    Octavia Bruckner, PA-C [facility]  [phone]

## 2018-08-15 ENCOUNTER — Inpatient Hospital Stay: Payer: Medicare Other

## 2018-08-15 LAB — BASIC METABOLIC PANEL
ANION GAP: 8 (ref 5–15)
BUN: 36 mg/dL — ABNORMAL HIGH (ref 8–23)
CALCIUM: 8.4 mg/dL — AB (ref 8.9–10.3)
CO2: 29 mmol/L (ref 22–32)
Chloride: 102 mmol/L (ref 98–111)
Creatinine, Ser: 1.06 mg/dL (ref 0.61–1.24)
Glucose, Bld: 99 mg/dL (ref 70–99)
Potassium: 4.5 mmol/L (ref 3.5–5.1)
Sodium: 139 mmol/L (ref 135–145)

## 2018-08-15 LAB — CBC
HCT: 25 % — ABNORMAL LOW (ref 40.0–52.0)
HEMOGLOBIN: 8.9 g/dL — AB (ref 13.0–18.0)
MCH: 36 pg — ABNORMAL HIGH (ref 26.0–34.0)
MCHC: 35.7 g/dL (ref 32.0–36.0)
MCV: 100.9 fL — ABNORMAL HIGH (ref 80.0–100.0)
Platelets: 64 10*3/uL — ABNORMAL LOW (ref 150–440)
RBC: 2.48 MIL/uL — AB (ref 4.40–5.90)
RDW: 14.8 % — ABNORMAL HIGH (ref 11.5–14.5)
WBC: 2 10*3/uL — AB (ref 3.8–10.6)

## 2018-08-15 LAB — HIV ANTIBODY (ROUTINE TESTING W REFLEX): HIV Screen 4th Generation wRfx: NONREACTIVE

## 2018-08-15 MED ORDER — METHYLPREDNISOLONE SODIUM SUCC 40 MG IJ SOLR: 20.0000 mg | Freq: Every day | INTRAMUSCULAR | 0 refills | Status: AC

## 2018-08-15 MED ORDER — IPRATROPIUM-ALBUTEROL 0.5-2.5 (3) MG/3ML IN SOLN: 3.0000 mL | Freq: Four times a day (QID) | RESPIRATORY_TRACT | Status: AC

## 2018-08-15 MED ORDER — FAMOTIDINE IN NACL 20-0.9 MG/50ML-% IV SOLN: 20.0000 mg | Freq: Two times a day (BID) | INTRAVENOUS | Status: AC

## 2018-08-15 NOTE — Progress Notes (Signed)
[facility] NAME: Nathaniel Dixon    MR#:  [id number]  DATE OF BIRTH:  Dec 23, 1951  SUBJECTIVE:  CHIEF COMPLAINT:   Chief Complaint  Patient presents with  . Shortness of Breath   - cough and wheezing today - abdomen remains distended, nauseous today  REVIEW OF SYSTEMS:  Review of Systems  Constitutional: Negative for chills, fever and malaise/fatigue.  HENT: Negative for congestion, ear discharge, hearing loss and nosebleeds.   Eyes: Negative for blurred vision and double vision.  Respiratory: Positive for cough, shortness of breath and wheezing.   Cardiovascular: Negative for chest pain and palpitations.  Gastrointestinal: Positive for abdominal pain and nausea. Negative for constipation, diarrhea and vomiting.  Genitourinary: Negative for dysuria.  Musculoskeletal: Negative for myalgias.  Neurological: Negative for dizziness, focal weakness, seizures, weakness and headaches.  Psychiatric/Behavioral: Negative for depression.    DRUG ALLERGIES:   Allergies  Allergen Reactions  . Statins Rash    Pain in legs, weakness Pain in legs, weakness    VITALS:  Blood pressure 108/78, pulse 80, temperature 97.8 F (36.6 C), temperature source Oral, resp. rate 20, height 6\' 2"  (1.88 m), weight 97.5 kg, SpO2 98 %.  PHYSICAL EXAMINATION:  Physical Exam   GENERAL:  67 y.o.-year-old patient lying in the bed with no acute distress.  EYES: Pupils equal, round, reactive to light and accommodation. No scleral icterus. Extraocular muscles intact.  HEENT: Head atraumatic, normocephalic. Oropharynx and nasopharynx clear.  NECK:  Supple, no jugular venous distention. No thyroid enlargement, no tenderness.  LUNGS: Normal breath sounds bilaterally, no wheezing or crepitation. No use of accessory muscles of respiration. Coarse bibasilar breath sounds CARDIOVASCULAR: S1, S2 normal. No murmurs, rubs, or gallops.  ABDOMEN: Soft, distended  with hypoactive bowel sounds. Bowel sounds present. No organomegaly or mass.  EXTREMITIES: No pedal edema, cyanosis, or clubbing.  NEUROLOGIC: Cranial nerves II through XII are intact. Muscle strength 5/5 in all extremities. Sensation intact. Gait not checked.  PSYCHIATRIC: The patient is alert and oriented x 3.  SKIN: No obvious rash, lesion, or ulcer.    LABORATORY PANEL:   CBC Recent Labs  Lab 08/15/18 0257  WBC 2.0*  HGB 8.9*  HCT 25.0*  PLT 64*   ------------------------------------------------------------------------------------------------------------------  Chemistries  Recent Labs  Lab 08/13/18 0810  08/15/18 0257  NA 137   < > 139  K 4.3   < > 4.5  CL 95*   < > 102  CO2 24   < > 29  GLUCOSE 176*   < > 99  BUN 41*   < > 36*  CREATININE 1.40*   < > 1.06  CALCIUM 9.4   < > 8.4*  MG 1.8  --   --   AST 37  --   --   ALT 16  --   --   ALKPHOS 68  --   --   BILITOT 1.0  --   --    < > = values in this interval not displayed.   ------------------------------------------------------------------------------------------------------------------  Cardiac Enzymes Recent Labs  Lab 08/13/18 0810  TROPONINI <0.03   ------------------------------------------------------------------------------------------------------------------  RADIOLOGY:  Dg Abd 1 View  Result Date: 08/15/2018 CLINICAL DATA:  Status post NG tube placement. EXAM: ABDOMEN - 1 VIEW COMPARISON:  Plain film of the abdomen 08/14/2018. CT chest, abdomen and pelvis 08/13/2018. FINDINGS: NG tube is in place with both the tip and side-port in the stomach. Gaseous distention of bowel appears  unchanged. IMPRESSION: NG tube in good position. No change in gaseous distention of bowel. Electronically Signed   By: Inge Rise M.D.   On: 08/15/2018 11:23   Dg Abd 1 View  Result Date: 08/14/2018 CLINICAL DATA:  Small bowel obstruction. EXAM: ABDOMEN - 1 VIEW COMPARISON:  Body CT 08/13/2018 FINDINGS: Diffuse  gaseous distension of small bowel loops throughout the abdomen with maximum transverse diameter of 4.9 cm. Preserved colonic gas pattern. No radio-opaque calculi or other significant radiographic abnormality are seen. IMPRESSION: Ongoing incomplete or intermittent small bowel obstruction. Electronically Signed   By: Fidela Salisbury M.D.   On: 08/14/2018 17:46   Ct Angio Chest Pe W And/or Wo Contrast  Result Date: 08/13/2018 CLINICAL DATA:  Non-small cell lung cancer, adrenal/peritoneal metastases. Shortness of breath. EXAM: CT ANGIOGRAPHY CHEST CT ABDOMEN AND PELVIS WITH CONTRAST TECHNIQUE: Multidetector CT imaging of the chest was performed using the standard protocol during bolus administration of intravenous contrast. Multiplanar CT image reconstructions and MIPs were obtained to evaluate the vascular anatomy. Multidetector CT imaging of the abdomen and pelvis was performed using the standard protocol during bolus administration of intravenous contrast. CONTRAST:  170mL ISOVUE-370 IOPAMIDOL (ISOVUE-370) INJECTION 76% COMPARISON:  None. FINDINGS: CTA CHEST FINDINGS Cardiovascular: Satisfactory opacification the bilateral pulmonary artery to the lobar level. No evidence of pulmonary embolism. Cardiomegaly.  No pericardial effusion. 4.3 cm thoracic aortic aneurysm at the level of the aortic root (series 6/image 144). Mediastinum/Nodes: No suspicious mediastinal lymphadenopathy. Bilateral perihilar regions are poorly evaluated. Visualized thyroid is unremarkable. Lungs/Pleura: Left upper lobe atelectasis/collapse with air bronchograms. Mild masslike opacity inferiorly in the lingula (series 5/image 40). Additional patchy/masslike opacity in the central left lower infrahilar region (series 5/image 37) and inferior left lower lobe (series 7/image 49). Moderate left pleural effusion. Right perihilar opacity, favoring radiation changes (series 7/image 32). Additional nodular/masslike opacity in the medial right  lower lobe (series 5/image 35). Mild interlobular septal thickening in the lungs bilaterally. Mild subpleural nodularity in the lungs bilaterally, measuring up to 9 mm in the right middle lobe (series 7/image 44). Trace right pleural effusion.  No pneumothorax. Overall, in the absence of prior imaging, it is difficult to discern to what degree this reflects post treatment changes versus tumor versus acute infection. However, radiation changes in the left upper lobe and bilateral perihilar regions are suspected. Superimposed tumor in the medial right lower lobe and left lower lobe are not excluded, although infection could have a similar appearance. Lymphangitic spread of tumor is suspected in the lungs bilaterally, with additional subpleural pulmonary metastases. Musculoskeletal: Mild degenerative changes the lower thoracic spine. Review of the MIP images confirms the above findings. CT ABDOMEN and PELVIS FINDINGS Hepatobiliary: Liver is within normal limits. No suspicious/enhancing hepatic lesions. Gallbladder is unremarkable. No intrahepatic or extrahepatic ductal dilatation. Pancreas: Within normal limits. Spleen: Within normal limits. Adrenals/Urinary Tract: 2.2 cm left adrenal metastasis. Mild thickening of the right adrenal gland without discrete mass. Kidneys are within normal limits.  No hydronephrosis. Bladder is within normal limits. Stomach/Bowel: Distended stomach. Multiple loops of dilated small bowel in the central abdomen, gradually tapering to decompressed loops of small bowel in the right lower quadrant. Possible transition point in the right mid abdomen (series 8/image 43). This appearance is considered worrisome for at least partial small bowel obstruction. Appendix is grossly unremarkable (series 8/image 32). Colon is relatively decompressed. Vascular/Lymphatic: No evidence of abdominal aortic aneurysm. Atherosclerotic calcifications of the abdominal aorta and branch vessels. No suspicious  abdominopelvic lymphadenopathy. Reproductive:  Prostate is unremarkable. Other: Small volume abdominopelvic ascites. Associated peritoneal disease/omental caking, for example beneath the right lateral abdominal wall (series 8/image 40) and beneath the lower anterior abdominopelvic wall (series 8/image 64). Musculoskeletal: Visualized osseous structures are within normal limits. Review of the MIP images confirms the above findings. IMPRESSION: No evidence of pulmonary embolism. Mildly dilated loops of small bowel in the central abdomen, with possible transition point in the right mid abdomen, worrisome for at least partial small bowel obstruction. Suspected radiation changes in the left upper lobe and bilateral perihilar regions. Superimposed patchy/masslike opacities in the bilateral lower lobes are worrisome for tumor, less likely infection. 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 Small left pleural effusion. Left adrenal metastasis. Associated peritoneal disease/omental caking and abdominopelvic ascites. Consider correlation with prior outside hospital studies for more reliable assessment of the patient's oncologic status. Electronically Signed   By: Julian Hy M.D.   On: 08/13/2018 12:54   Ct Abdomen Pelvis W Contrast  Result Date: 08/13/2018 CLINICAL DATA:  Non-small cell lung cancer, adrenal/peritoneal metastases. Shortness of breath. EXAM: CT ANGIOGRAPHY CHEST CT ABDOMEN AND PELVIS WITH CONTRAST TECHNIQUE: Multidetector CT imaging of the chest was performed using the standard protocol during bolus administration of intravenous contrast. Multiplanar CT image reconstructions and MIPs were obtained to evaluate the vascular anatomy. Multidetector CT imaging of the abdomen and pelvis was performed using the standard protocol during bolus administration of intravenous contrast. CONTRAST:  152mL ISOVUE-370 IOPAMIDOL (ISOVUE-370)  INJECTION 76% COMPARISON:  None. FINDINGS: CTA CHEST FINDINGS Cardiovascular: Satisfactory opacification the bilateral pulmonary artery to the lobar level. No evidence of pulmonary embolism. Cardiomegaly.  No pericardial effusion. 4.3 cm thoracic aortic aneurysm at the level of the aortic root (series 6/image 144). Mediastinum/Nodes: No suspicious mediastinal lymphadenopathy. Bilateral perihilar regions are poorly evaluated. Visualized thyroid is unremarkable. Lungs/Pleura: Left upper lobe atelectasis/collapse with air bronchograms. Mild masslike opacity inferiorly in the lingula (series 5/image 40). Additional patchy/masslike opacity in the central left lower infrahilar region (series 5/image 37) and inferior left lower lobe (series 7/image 49). Moderate left pleural effusion. Right perihilar opacity, favoring radiation changes (series 7/image 32). Additional nodular/masslike opacity in the medial right lower lobe (series 5/image 35). Mild interlobular septal thickening in the lungs bilaterally. Mild subpleural nodularity in the lungs bilaterally, measuring up to 9 mm in the right middle lobe (series 7/image 44). Trace right pleural effusion.  No pneumothorax. Overall, in the absence of prior imaging, it is difficult to discern to what degree this reflects post treatment changes versus tumor versus acute infection. However, radiation changes in the left upper lobe and bilateral perihilar regions are suspected. Superimposed tumor in the medial right lower lobe and left lower lobe are not excluded, although infection could have a similar appearance. Lymphangitic spread of tumor is suspected in the lungs bilaterally, with additional subpleural pulmonary metastases. Musculoskeletal: Mild degenerative changes the lower thoracic spine. Review of the MIP images confirms the above findings. CT ABDOMEN and PELVIS FINDINGS Hepatobiliary: Liver is within normal limits. No suspicious/enhancing hepatic lesions. Gallbladder is  unremarkable. No intrahepatic or extrahepatic ductal dilatation. Pancreas: Within normal limits. Spleen: Within normal limits. Adrenals/Urinary Tract: 2.2 cm left adrenal metastasis. Mild thickening of the right adrenal gland without discrete mass. Kidneys are within normal limits.  No hydronephrosis. Bladder is within normal limits. Stomach/Bowel: Distended stomach. Multiple loops of dilated small bowel in the central abdomen, gradually tapering to decompressed loops of small bowel in the right lower quadrant. Possible  transition point in the right mid abdomen (series 8/image 43). This appearance is considered worrisome for at least partial small bowel obstruction. Appendix is grossly unremarkable (series 8/image 32). Colon is relatively decompressed. Vascular/Lymphatic: No evidence of abdominal aortic aneurysm. Atherosclerotic calcifications of the abdominal aorta and branch vessels. No suspicious abdominopelvic lymphadenopathy. Reproductive: Prostate is unremarkable. Other: Small volume abdominopelvic ascites. Associated peritoneal disease/omental caking, for example beneath the right lateral abdominal wall (series 8/image 40) and beneath the lower anterior abdominopelvic wall (series 8/image 64). Musculoskeletal: Visualized osseous structures are within normal limits. Review of the MIP images confirms the above findings. IMPRESSION: No evidence of pulmonary embolism. Mildly dilated loops of small bowel in the central abdomen, with possible transition point in the right mid abdomen, worrisome for at least partial small bowel obstruction. Suspected radiation changes in the left upper lobe and bilateral perihilar regions. Superimposed patchy/masslike opacities in the bilateral lower lobes are worrisome for tumor, less likely infection. 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 Small left pleural effusion. Left adrenal metastasis. Associated  peritoneal disease/omental caking and abdominopelvic ascites. Consider correlation with prior outside hospital studies for more reliable assessment of the patient's oncologic status. Electronically Signed   By: Julian Hy M.D.   On: 08/13/2018 12:54    EKG:   Orders placed or performed during the hospital encounter of 08/13/18  . ED EKG  . ED EKG  . EKG 12-Lead  . EKG 12-Lead  . 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  1.  Small bowel obstruction-no significant change clinically.   - nauseous today- recommend NG tube -Follow-up KUB.  Appreciate surgical consult.  Conservative management at this time -No acute surgical indication at this time.  If surgery is needed-patient would like to be transferred to [facility].  He is on the waiting list. -continue to keep n.p.o. at this time  2.  Acute on chronic respiratory failure-secondary to radiation pneumonitis.  Also underlying lung cancer -Continue supplemental oxygen -Patient takes oral prednisone daily at home-changed to IV steroids here while he is n.p.o. -Also chest x-ray with some vascular congestion, decrease the rate of IV fluids. - wheezing today- nebs and also CXR  3.  Non-small cell lung cancer-adenocarcinoma of the lung diagnosed at the end of 2017 -Status post chemo and radiation.  Finished chemotherapy in May 2018.  Recent scans with adrenal nodule and omental mets, status post adrenal biopsy confirming metastatic disease. -Received first cycle of chemotherapy from that recently.  4.  Pancytopenia-likely from recent chemotherapy.  Continue to monitor closely.  Daily labs.  No indication for any transfusion at this time.  5.  DVT prophylaxis-teds and SCDs   Discussed with  patients oncologist Dr. Lissa Morales at [facility] Wife at bedside and uopdated  All the records are reviewed and case discussed with Care Management/Social Workerr. Management plans discussed with the patient, family and they are in agreement.  CODE STATUS: Full Code  TOTAL TIME TAKING CARE OF THIS PATIENT: 36 minutes.   POSSIBLE D/C IN 2 DAYS, DEPENDING ON CLINICAL CONDITION.   Gladstone Lighter M.D on 08/15/2018 at 11:43 AM  Between 7am to 6pm - Pager - [phone]  After 6pm go to [email or web address] - password [email or web address]  [employer]  [phone]  CC: Primary care physician; Gayland Curry, MD

## 2018-08-15 NOTE — Care Management Important Message (Signed)
Copy of signed IM left with patient in room.  

## 2018-08-15 NOTE — Discharge Summary (Signed)
[place] [facility] [place] NAME: Nathaniel Dixon    MR#:  [id number]  DATE OF BIRTH:  [birth year]  DATE OF ADMISSION:  08/13/2018   ADMITTING PHYSICIAN: Demetrios Loll, MD  DATE OF DISCHARGE: 08/15/18  PRIMARY CARE PHYSICIAN: Gayland Curry, MD   ADMISSION DIAGNOSIS:   Partial small bowel obstruction ([place]) [K56.600] Abdominal pain [R10.9] Hematemesis with nausea [K92.0]  DISCHARGE DIAGNOSIS:   Active Problems:   SBO (small bowel obstruction) ([place])   SECONDARY DIAGNOSIS:   Past Medical History:  Diagnosis Date  . 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  1.  Small bowel obstruction-no significant change clinically.   - nauseous today- recommend NG tube -Follow-up KUB.  Appreciate surgical consult.  Conservative management at this time -No acute surgical indication at this time.  If surgery is needed-patient would like to be transferred to [facility].  He is on the waiting list. -continue to keep n.p.o. at this time  2.  Acute on chronic respiratory failure-secondary to radiation pneumonitis.  Also underlying lung cancer -Continue supplemental oxygen -Patient takes oral prednisone daily at home-changed to IV steroids here while he is n.p.o. -Also chest x-ray with some vascular congestion, decrease the rate of IV fluids. - wheezing today- nebs and also CXR  3.  Non-small cell lung cancer-adenocarcinoma of the lung diagnosed at the end of 2017 -Status post chemo and radiation.  Finished chemotherapy in May 2018.  Recent scans with adrenal nodule and omental mets, status post adrenal biopsy confirming metastatic disease. -Received first cycle of chemotherapy from that  recently.  4.  Pancytopenia-likely from recent chemotherapy.  Continue to monitor closely.  Daily labs.  No indication for any transfusion at this time.  5.  DVT prophylaxis-teds and SCDs   Wife at bedside and updated  DISCHARGE CONDITIONS:   Guarded  CONSULTS OBTAINED:   Treatment Team:  Herbert Pun, MD Corey Skains, MD  DRUG ALLERGIES:   Allergies  Allergen Reactions  . Statins Rash    Pain in legs, weakness Pain in legs, weakness   DISCHARGE MEDICATIONS:   Allergies as of 08/15/2018      Reactions   Statins Rash   Pain in legs, weakness Pain in legs, weakness      Medication List    STOP taking these medications   albuterol 108 (90 Base) MCG/ACT inhaler Commonly known as:  PROVENTIL HFA;VENTOLIN HFA   dextromethorphan 30 MG/5ML liquid Commonly known as:  DELSYM   docusate sodium 100 MG capsule Commonly known as:  COLACE   guaiFENesin 600 MG 12 hr tablet Commonly known as:  MUCINEX   HYDROcodone-homatropine 5-1.5 MG/5ML syrup Commonly known as:  HYCODAN   lactulose 10 GM/15ML solution Commonly known as:  CHRONULAC   lidocaine 2 % solution Commonly known as:  XYLOCAINE   mirtazapine 15 MG tablet Commonly known as:  REMERON   omega-3 acid ethyl esters 1 g capsule Commonly known as:  LOVAZA   omeprazole 20 MG capsule Commonly known as:  PRILOSEC   predniSONE 10 MG tablet Commonly known as:  DELTASONE   prochlorperazine 10 MG tablet Commonly known as:  COMPAZINE   senna 8.6 MG Tabs tablet Commonly known as:  [place] [employer] [place] [employer]  TAKE these medications   famotidine 20-0.9 MG/50ML-% Commonly known as:  PEPCID Inject 50 mLs (20 mg total) into the vein every 12 (twelve) hours.   HYDROmorphone 2 MG tablet Commonly known as:  DILAUDID Take 2 mg by mouth every 3 (three) hours as needed for severe pain.   ipratropium-albuterol 0.5-2.5 (3) MG/3ML Soln Commonly known as:  DUONEB Take 3 mLs by nebulization every 6 (six) hours.    methylPREDNISolone sodium succinate 40 mg/mL injection Commonly known as:  SOLU-MEDROL Inject 0.5 mLs (20 mg total) into the vein daily. Start taking on:  08/16/2018   Morphine Sulfate ER 15 MG T12a Take 15 mg by mouth every 12 (twelve) hours.   oxyCODONE 5 MG immediate release tablet Commonly known as:  Oxy IR/ROXICODONE Take 5-10 mg by mouth every 4 (four) hours as needed for severe pain.        DISCHARGE LOCATION:   Patient to be transferred to [facility] when bed is available    If you experience worsening of your admission symptoms, develop shortness of breath, life threatening emergency, suicidal or homicidal thoughts you must seek medical attention immediately by calling 911 or calling your MD immediately  if symptoms less severe.  You Must read complete instructions/literature along with all the possible adverse reactions/side effects for all the Medicines you take and that have been prescribed to you. Take any new Medicines after you have completely understood and accpet all the possible adverse reactions/side effects.   Please note  You were cared for by a hospitalist during your hospital stay. If you have any questions about your discharge medications or the care you received while you were in the hospital after you are discharged, you can call the unit and asked to speak with the hospitalist on call if the hospitalist that took care of you is not available. Once you are discharged, your primary care physician will handle any further medical issues. Please note that NO REFILLS for any discharge medications will be authorized once you are discharged, as it is imperative that you return to your primary care physician (or establish a relationship with a primary care physician if you do not have one) for your aftercare needs so that they can reassess your need for medications and monitor your lab values.    On the day of Discharge:  VITAL SIGNS:   Blood pressure 108/78,  pulse 80, temperature 97.8 F (36.6 C), temperature source Oral, resp. rate 20, height 6\' 2"  (1.88 m), weight 97.5 kg, SpO2 98 %.  PHYSICAL EXAMINATION:    GENERAL:  67 y.o.-year-old patient lying in the bed with no acute distress.  EYES: Pupils equal, round, reactive to light and accommodation. No scleral icterus. Extraocular muscles intact.  HEENT: Head atraumatic, normocephalic. Oropharynx and nasopharynx clear.  NECK:  Supple, no jugular venous distention. No thyroid enlargement, no tenderness.  LUNGS: Normal breath sounds bilaterally, no wheezing or crepitation. No use of accessory muscles of respiration. Coarse bibasilar breath sounds CARDIOVASCULAR: S1, S2 normal. No murmurs, rubs, or gallops.  ABDOMEN: Soft, distended with hypoactive bowel sounds. Bowel sounds present. No organomegaly or mass.  EXTREMITIES: No pedal edema, cyanosis, or clubbing.  NEUROLOGIC: Cranial nerves II through XII are intact. Muscle strength 5/5 in all extremities. Sensation intact. Gait not checked.  PSYCHIATRIC: The patient is alert and oriented x 3.  SKIN: No obvious rash, lesion, or ulcer  DATA REVIEW:   CBC Recent Labs  Lab 08/15/18 0257  WBC 2.0*  HGB 8.9*  HCT 25.0*  PLT 64*    Chemistries  Recent Labs  Lab 08/13/18 0810  08/15/18 0257  NA 137   < > 139  K 4.3   < > 4.5  CL 95*   < > 102  CO2 24   < > 29  GLUCOSE 176*   < > 99  BUN 41*   < > 36*  CREATININE 1.40*   < > 1.06  CALCIUM 9.4   < > 8.4*  MG 1.8  --   --   AST 37  --   --   ALT 16  --   --   ALKPHOS 68  --   --   BILITOT 1.0  --   --    < > = values in this interval not displayed.     Microbiology Results  Results for orders placed or performed during the hospital encounter of 03/31/17  Blood Culture (routine x 2)     Status: None   Collection Time: 03/31/17  8:56 PM  Result Value Ref Range Status   Specimen Description BLOOD RIGHT HAND  Final   Special Requests   Final    BOTTLES DRAWN AEROBIC AND ANAEROBIC  Blood Culture results may not be optimal due to an inadequate volume of blood received in culture bottles   Culture NO GROWTH 5 DAYS  Final   Report Status 04/05/2017 FINAL  Final  Blood Culture (routine x 2)     Status: None   Collection Time: 03/31/17  8:57 PM  Result Value Ref Range Status   Specimen Description BLOOD LT [place]  Final   Special Requests Blood Culture adequate volume  Final   Culture NO GROWTH 5 DAYS  Final   Report Status 04/05/2017 FINAL  Final  Urine culture     Status: None   Collection Time: 04/01/17 12:40 AM  Result Value Ref Range Status   Specimen Description URINE, RANDOM  Final   Special Requests NONE  Final   Culture   Final    NO GROWTH Performed at [facility], [place] [address], [place], [place] [address]    Report Status 04/02/2017 FINAL  Final    RADIOLOGY:  Dg Abd 1 View  Result Date: 08/15/2018 CLINICAL DATA:  Status post NG tube placement. EXAM: ABDOMEN - 1 VIEW COMPARISON:  Plain film of the abdomen 08/14/2018. CT chest, abdomen and pelvis 08/13/2018. FINDINGS: NG tube is in place with both the tip and side-port in the stomach. Gaseous distention of bowel appears unchanged. IMPRESSION: NG tube in good position. No change in gaseous distention of bowel. Electronically Signed   By: Inge Rise M.D.   On: 08/15/2018 11:23   Dg Abd 1 View  Result Date: 08/14/2018 CLINICAL DATA:  Small bowel obstruction. EXAM: ABDOMEN - 1 VIEW COMPARISON:  Body CT 08/13/2018 FINDINGS: Diffuse gaseous distension of small bowel loops throughout the abdomen with maximum transverse diameter of 4.9 cm. Preserved colonic gas pattern. No radio-opaque calculi or other significant radiographic abnormality are seen. IMPRESSION: Ongoing incomplete or intermittent small bowel obstruction. Electronically Signed   By: Fidela Salisbury M.D.   On: 08/14/2018 17:46     Management plans discussed with the patient, family and they are in agreement.  CODE STATUS:     Code  Status Orders  (From admission, onward)         Start     Ordered   08/13/18 1602  Full code  Continuous  08/13/18 1601        Code Status History    Date Active Date Inactive Code Status Order ID Comments User Context   04/01/2017 0032 04/02/2017 1533 Full Code [id number]  Hugelmeyer, Ubaldo Glassing, DO Inpatient    Advance Directive Documentation     Most Recent Value  Type of Advance Directive  Healthcare Power of Attorney  Pre-existing out of facility DNR order (yellow form or pink MOST form)  -  "MOST" Form in Place?  -      TOTAL TIME TAKING CARE OF THIS PATIENT: 38 minutes.    Gladstone Lighter M.D on 08/15/2018 at 11:50 AM  Between 7am to 6pm - Pager - [phone]  After 6pm go to [email or web address] - Proofreader  [facility]  Office  [phone]  CC: Primary care physician; Gayland Curry, MD   Note: This dictation was prepared with Dragon dictation along with smaller phrase technology. Any transcriptional errors that result from this process are unintentional.

## 2018-08-15 NOTE — Progress Notes (Signed)
[facility] transfer center called to ask it patient would be willing to be transferred tonight, if bed found; patient agreeable; will wait for notification; patient voiced concern regarding "who pays for transfer"; unable to answer; Barbaraann Faster, RN 12:04 AM.; 08/15/2018

## 2018-08-15 NOTE — Progress Notes (Signed)
Patient ID: Nathaniel Dixon, male   DOB: 08/11/1951, 67 y.o.   MRN: [id number]     [place] Hospital Day(s): 2.   Post op day(s):  Marland Kitchen   Interval History: Patient seen and examined, no acute events or new complaints overnight. Patient reports feeling a little bit nauseous but that still passing flatus. Denies vomiting.   Vital signs in last 24 hours: [min-max] current  Temp:  [97.8 F (36.6 C)-98.3 F (36.8 C)] 97.8 F (36.6 C) (08/16 0522) Pulse Rate:  [72-80] 80 (08/16 0522) Resp:  [16-20] 20 (08/16 0522) BP: (100-108)/(65-79) 108/78 (08/16 0522) SpO2:  [96 %-100 %] 99 % (08/16 0522)     Height: 6\' 2"  (188 cm) Weight: 97.5 kg BMI (Calculated): 27.59    Physical Exam:  Constitutional: alert, cooperative and no distress  Respiratory: breathing non-labored at rest  Cardiovascular: regular rate and sinus rhythm  Gastrointestinal: soft, mild-tender, and mild-distended. Bowel sounds positive  Labs:  CBC Latest Ref Rng & Units 08/15/2018 08/14/2018 08/13/2018  WBC 3.8 - 10.6 K/uL 2.0(L) 2.7(L) 5.1  Hemoglobin 13.0 - 18.0 g/dL 8.9(L) 10.2(L) 12.6(L)  Hematocrit 40.0 - 52.0 % 25.0(L) 28.9(L) 36.3(L)  Platelets 150 - 440 K/uL 64(L) 70(L) 91(L)   CMP Latest Ref Rng & Units 08/15/2018 08/14/2018 08/13/2018  Glucose 70 - 99 mg/dL 99 108(H) 176(H)  BUN 8 - 23 mg/dL 36(H) 45(H) 41(H)  Creatinine 0.61 - 1.24 mg/dL 1.06 1.19 1.40(H)  Sodium 135 - 145 mmol/L 139 139 137  Potassium 3.5 - 5.1 mmol/L 4.5 4.8 4.3  Chloride 98 - 111 mmol/L 102 101 95(L)  CO2 22 - 32 mmol/L 29 29 24   Calcium 8.9 - 10.3 mg/dL 8.4(L) 8.9 9.4  Total Protein 6.5 - 8.1 g/dL - - 7.5  Total Bilirubin 0.3 - 1.2 mg/dL - - 1.0  Alkaline Phos 38 - 126 U/L - - 68  AST 15 - 41 U/L - - 37  ALT 0 - 44 U/L - - 16    Imaging studies: No new pertinent imaging studies   Assessment/Plan:  67 y.o.malewith nausea and vomiting, complicated by pertinent comorbidities includinglung cancer on home oxygen, metastasis  to abdominal cavity and adrenal glands, GERD, hyperlipidemia, constipation.  Today continue to pass gas per rectum but feels nauseous. Patient wants to hold oral intake trial until nausea gets better. I agree. I do not want to force patient to eat and provoke vomiting if he does not feels right, even though he is passing flatus and abdominal exam is improving. If he feels better later during the day, clear liquid may be started. Will follow.    Arnold Long, MD

## 2018-08-16 ENCOUNTER — Inpatient Hospital Stay: Payer: Medicare Other

## 2018-08-16 NOTE — Progress Notes (Signed)
Patient ID: Nathaniel Dixon, male   DOB: 06/21/1951, 67 y.o.   MRN: [id number]     [place] Hospital Day(s): 3.   Post op day(s):  Marland Kitchen   Interval History: Patient seen and examined, no acute events or new complaints overnight. Patient reports still feeling nausea with minimal gas per rectum, denies vomiting.  Vital signs in last 24 hours: [min-max] current  Temp:  [97.5 F (36.4 C)-98 F (36.7 C)] 98 F (36.7 C) (08/17 1202) Pulse Rate:  [70-86] 70 (08/17 1202) Resp:  [16-20] 16 (08/17 1202) BP: (110-120)/(78-83) 110/83 (08/17 1202) SpO2:  [94 %-98 %] 97 % (08/17 1202)     Height: 6\' 2"  (188 cm) Weight: 97.5 kg BMI (Calculated): 27.59   Physical Exam:  Constitutional: alert, cooperative and no distress  Respiratory: breathing non-labored at rest  Cardiovascular: regular rate and sinus rhythm  Gastrointestinal: soft, mild-tender, and mild-distended  Labs:  CBC Latest Ref Rng & Units 08/15/2018 08/14/2018 08/13/2018  WBC 3.8 - 10.6 K/uL 2.0(L) 2.7(L) 5.1  Hemoglobin 13.0 - 18.0 g/dL 8.9(L) 10.2(L) 12.6(L)  Hematocrit 40.0 - 52.0 % 25.0(L) 28.9(L) 36.3(L)  Platelets 150 - 440 K/uL 64(L) 70(L) 91(L)   CMP Latest Ref Rng & Units 08/15/2018 08/14/2018 08/13/2018  Glucose 70 - 99 mg/dL 99 108(H) 176(H)  BUN 8 - 23 mg/dL 36(H) 45(H) 41(H)  Creatinine 0.61 - 1.24 mg/dL 1.06 1.19 1.40(H)  Sodium 135 - 145 mmol/L 139 139 137  Potassium 3.5 - 5.1 mmol/L 4.5 4.8 4.3  Chloride 98 - 111 mmol/L 102 101 95(L)  CO2 22 - 32 mmol/L 29 29 24   Calcium 8.9 - 10.3 mg/dL 8.4(L) 8.9 9.4  Total Protein 6.5 - 8.1 g/dL - - 7.5  Total Bilirubin 0.3 - 1.2 mg/dL - - 1.0  Alkaline Phos 38 - 126 U/L - - 68  AST 15 - 41 U/L - - 37  ALT 0 - 44 U/L - - 16    Imaging studies:  EXAM: ABDOMEN - 1 VIEW  COMPARISON:  08/14/2018, 08/15/2018  FINDINGS: NG tube in the stomach with the tip near the duodenal bulb.  Improvement in small bowel dilatation. Progression of large bowel gas suggesting  resolving small bowel obstruction. Small amount of gas in the rectum.  IMPRESSION: Improving small-bowel obstruction pattern with increased colonic gas. NG tube in the region of the duodenal bulb.  Assessment/Plan:  67 y.o.malewith nausea and vomiting, complicated by pertinent comorbidities includinglung cancer on home oxygen, metastasis to abdominal cavity and adrenal glands, GERD, hyperlipidemia, constipation. Patient with improved xray but not significant clinical improvement. I agree to continue conservative management. Will not rush to remove the NGT to avoid the need of re insertion. Agree with current management. Pending transfer.   Arnold Long, MD

## 2018-08-16 NOTE — Progress Notes (Signed)
[place] at [place] NAME: Nathaniel Dixon    MR#:  [id number]  DATE OF BIRTH:  1951-02-23  SUBJECTIVE:  CHIEF COMPLAINT:   Chief Complaint  Patient presents with  . Shortness of Breath   -Remains on 4 L oxygen which is chronic.  KUB with some improvement today, however still complains of significant abdominal pain  REVIEW OF SYSTEMS:  Review of Systems  Constitutional: Negative for chills, fever and malaise/fatigue.  HENT: Negative for congestion, ear discharge, hearing loss and nosebleeds.   Eyes: Negative for blurred vision and double vision.  Respiratory: Positive for cough, shortness of breath and wheezing.   Cardiovascular: Negative for chest pain and palpitations.  Gastrointestinal: Positive for abdominal pain and nausea. Negative for constipation, diarrhea and vomiting.  Genitourinary: Negative for dysuria.  Musculoskeletal: Negative for myalgias.  Neurological: Negative for dizziness, focal weakness, seizures, weakness and headaches.  Psychiatric/Behavioral: Negative for depression.    DRUG ALLERGIES:   Allergies  Allergen Reactions  . Statins Rash    Pain in legs, weakness Pain in legs, weakness    VITALS:  Blood pressure 111/80, pulse 72, temperature (!) 97.5 F (36.4 C), temperature source Oral, resp. rate 20, height 6\' 2"  (1.88 m), weight 97.5 kg, SpO2 94 %.  PHYSICAL EXAMINATION:  Physical Exam   GENERAL:  67 y.o.-year-old patient lying in the bed with no acute distress.  EYES: Pupils equal, round, reactive to light and accommodation. No scleral icterus. Extraocular muscles intact.  HEENT: Head atraumatic, normocephalic. Oropharynx and nasopharynx clear.  NG tube in place NECK:  Supple, no jugular venous distention. No thyroid enlargement, no tenderness.  LUNGS: Normal breath sounds bilaterally, no wheezing or crepitation. No use of accessory muscles of respiration. Coarse bibasilar breath  sounds CARDIOVASCULAR: S1, S2 normal. No murmurs, rubs, or gallops.  ABDOMEN: Soft, distended with hypoactive bowel sounds. Bowel sounds present. No organomegaly or mass.  EXTREMITIES: No pedal edema, cyanosis, or clubbing.  NEUROLOGIC: Cranial nerves II through XII are intact. Muscle strength 5/5 in all extremities. Sensation intact. Gait not checked.  PSYCHIATRIC: The patient is alert and oriented x 3.  SKIN: No obvious rash, lesion, or ulcer.    LABORATORY PANEL:   CBC Recent Labs  Lab 08/15/18 0257  WBC 2.0*  HGB 8.9*  HCT 25.0*  PLT 64*   ------------------------------------------------------------------------------------------------------------------  Chemistries  Recent Labs  Lab 08/13/18 0810  08/15/18 0257  NA 137   < > 139  K 4.3   < > 4.5  CL 95*   < > 102  CO2 24   < > 29  GLUCOSE 176*   < > 99  BUN 41*   < > 36*  CREATININE 1.40*   < > 1.06  CALCIUM 9.4   < > 8.4*  MG 1.8  --   --   AST 37  --   --   ALT 16  --   --   ALKPHOS 68  --   --   BILITOT 1.0  --   --    < > = values in this interval not displayed.   ------------------------------------------------------------------------------------------------------------------  Cardiac Enzymes Recent Labs  Lab 08/13/18 0810  TROPONINI <0.03   ------------------------------------------------------------------------------------------------------------------  RADIOLOGY:  Dg Abd 1 View  Result Date: 08/16/2018 CLINICAL DATA:  Small-bowel obstruction EXAM: ABDOMEN - 1 VIEW COMPARISON:  08/14/2018, 08/15/2018 FINDINGS: NG tube in the stomach with the tip near the duodenal bulb. Improvement in small bowel dilatation.  Progression of large bowel gas suggesting resolving small bowel obstruction. Small amount of gas in the rectum. IMPRESSION: Improving small-bowel obstruction pattern with increased colonic gas. NG tube in the region of the duodenal bulb. Electronically Signed   By: Franchot Gallo M.D.   On:  08/16/2018 08:21   Dg Abd 1 View  Result Date: 08/15/2018 CLINICAL DATA:  Status post NG tube placement. EXAM: ABDOMEN - 1 VIEW COMPARISON:  Plain film of the abdomen 08/14/2018. CT chest, abdomen and pelvis 08/13/2018. FINDINGS: NG tube is in place with both the tip and side-port in the stomach. Gaseous distention of bowel appears unchanged. IMPRESSION: NG tube in good position. No change in gaseous distention of bowel. Electronically Signed   By: Inge Rise M.D.   On: 08/15/2018 11:23   Dg Abd 1 View  Result Date: 08/14/2018 CLINICAL DATA:  Small bowel obstruction. EXAM: ABDOMEN - 1 VIEW COMPARISON:  Body CT 08/13/2018 FINDINGS: Diffuse gaseous distension of small bowel loops throughout the abdomen with maximum transverse diameter of 4.9 cm. Preserved colonic gas pattern. No radio-opaque calculi or other significant radiographic abnormality are seen. IMPRESSION: Ongoing incomplete or intermittent small bowel obstruction. Electronically Signed   By: Fidela Salisbury M.D.   On: 08/14/2018 17:46   Dg Chest Port 1 View  Result Date: 08/16/2018 CLINICAL DATA:  Pneumonia.  Lung cancer EXAM: PORTABLE CHEST 1 VIEW COMPARISON:  CT chest and chest x-ray 08/13/2018 FINDINGS: Perihilar airspace densities bilaterally left greater than right are unchanged. Small left effusion unchanged with left lower lobe atelectasis/infiltrate. NG tube in the stomach IMPRESSION: Bilateral airspace disease left greater than right is unchanged. No new findings. Electronically Signed   By: Franchot Gallo M.D.   On: 08/16/2018 08:19    EKG:   Orders placed or performed during the hospital encounter of 08/13/18  . ED EKG  . ED EKG  . EKG 12-Lead  . EKG 12-Lead  . 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  1.  Small bowel obstruction-no significant change clinically.   -NG tube placed on 08/15/2018 with only 350 cc greenish material in the last 24 hours -Follow-up KUB with some improvement.  Appreciate surgical consult.  Conservative management at this time -No acute surgical indication at this time.   -continue to keep n.p.o. at this time - patient waiting to be transferred to [facility] out of bed and ambulation today  2.  Acute on chronic respiratory failure-secondary to radiation pneumonitis.  Also underlying lung cancer -Continue supplemental oxygen -Patient takes oral prednisone daily at home-changed to IV steroids here while he is n.p.o. -Also chest x-ray with some vascular congestion, decrease the rate of IV fluids. - wheezing - nebs and CXR with bibasilar infiltrates- chronic (? Radiation pneumonitis)  3.  Non-small cell lung cancer-adenocarcinoma of the lung diagnosed at the end of 2017 -Status post chemo and radiation.  Finished chemotherapy in May 2018.  Recent scans with adrenal nodule and omental mets, status post adrenal biopsy confirming metastatic disease. -Received first cycle of chemotherapy from that recently.  4.  Pancytopenia-likely from recent chemotherapy.  Continue to monitor closely.  Daily labs.  No indication for any transfusion at this time.  5.  DVT prophylaxis-teds and SCDs   Discussed with patients oncologist  NP  Ms. Doreatha Martin on 08/15/18 Wife at bedside and updated  All the records are reviewed and case discussed with Care Management/Social Workerr. Management plans discussed with the patient, family and they are in agreement.  CODE STATUS: Full Code  TOTAL TIME TAKING CARE OF THIS PATIENT: 36 minutes.   POSSIBLE D/C IN 1-2 DAYS, DEPENDING ON CLINICAL CONDITION.   Gladstone Lighter M.D on 08/16/2018 at 10:13 AM  Between 7am to 6pm - Pager - [phone]  After 6pm go to [email or web address] - password  [facility]  Office  [phone]  CC: Primary care physician; Gayland Curry, MD

## 2018-08-17 MED ORDER — PNEUMOCOCCAL VAC POLYVALENT 25 MCG/0.5ML IJ INJ: 0.5000 mL | INJECTION | INTRAMUSCULAR | Status: DC

## 2018-08-17 NOTE — Progress Notes (Signed)
Patient transported off unit via stretcher by [employer]. Patient in no distress and no pain. Patient given discharge packet and signed his portion of EMTALA form.

## 2018-08-31 DEATH — deceased

## 2019-04-05 IMAGING — DX DG ABDOMEN 1V
2 series · 2 of 2 positions shown · non-contrast
Comparison: 08/14/2018, 08/15/2018

CLINICAL DATA: Small-bowel obstruction

EXAM:
ABDOMEN - 1 VIEW

[abdomen supine (1 of 2)]
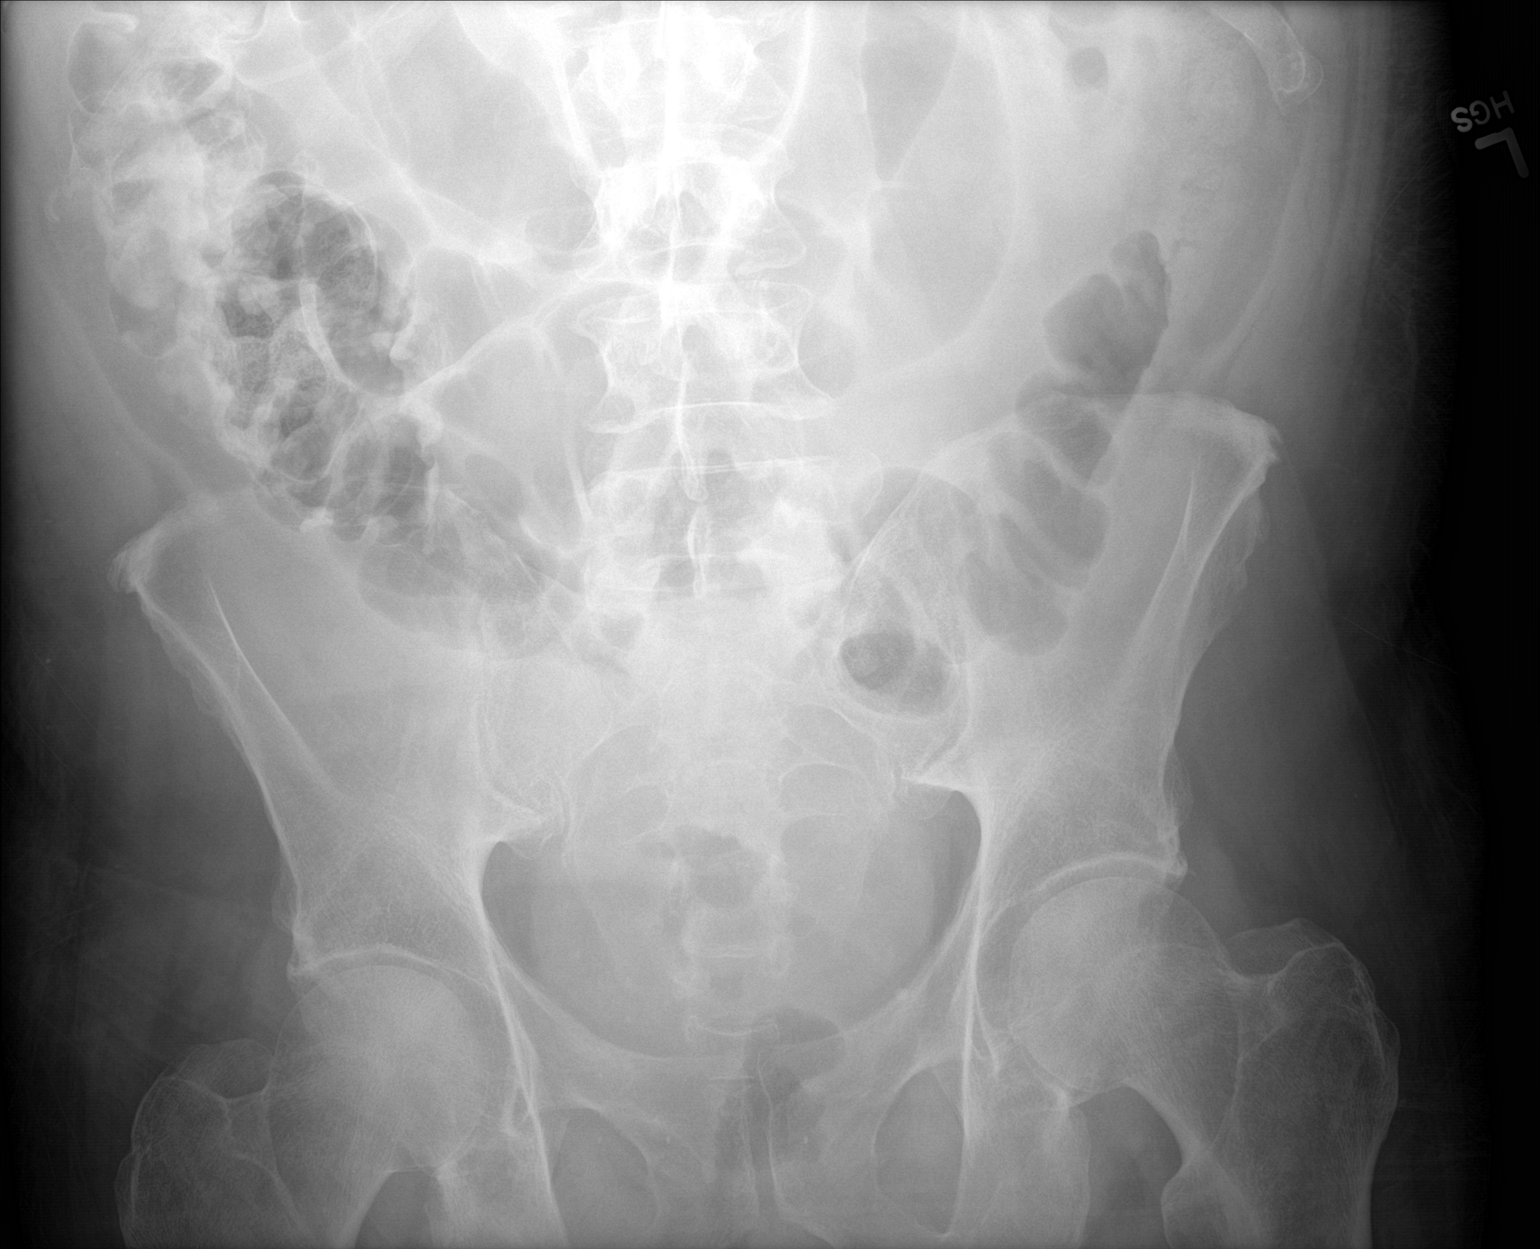

[abdomen supine (2 of 2)]
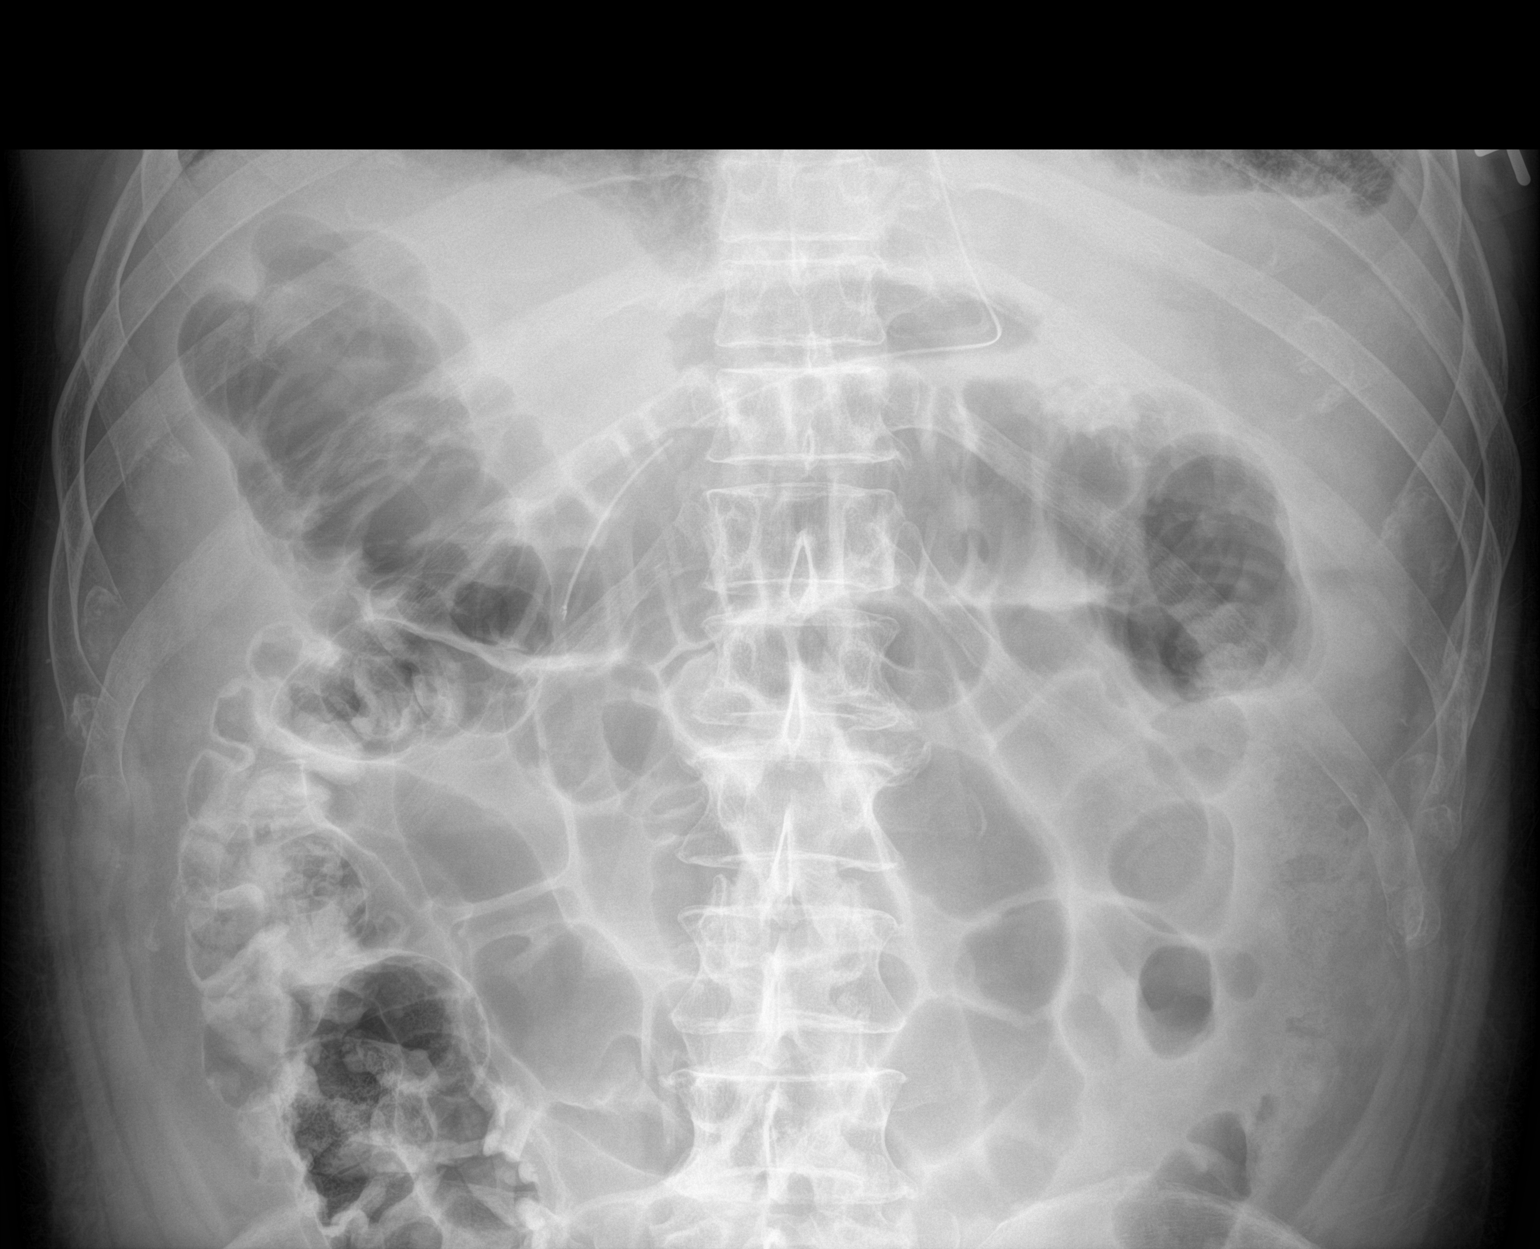

[2 of 2 positions shown; findings below may reference images not displayed]

FINDINGS: NG tube in the stomach with the tip near the duodenal bulb.

Improvement in small bowel dilatation. Progression of large bowel
gas suggesting resolving small bowel obstruction. Small amount of
gas in the rectum.
IMPRESSION: Improving small-bowel obstruction pattern with increased colonic
gas. NG tube in the region of the duodenal bulb.
